# Patient Record
Sex: Male | Born: 1990
Health system: Southern US, Community
[De-identification: ages and names within clinical notes are randomized; demographics above are authoritative.]

## PROBLEM LIST (undated history)

## (undated) DIAGNOSIS — Z889 Allergy status to unspecified drugs, medicaments and biological substances status: Secondary | ICD-10-CM

## (undated) HISTORY — PX: TONSILLECTOMY: SUR1361

## (undated) HISTORY — PX: SURGERY OF LIP: SUR1315

---
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
## 2009-07-15 ENCOUNTER — Emergency Department (HOSPITAL_COMMUNITY): Admission: EM | Admit: 2009-07-15 | Discharge: 2009-07-16 | Payer: Self-pay | Admitting: Emergency Medicine

## 2009-09-01 ENCOUNTER — Emergency Department (HOSPITAL_COMMUNITY): Admission: EM | Admit: 2009-09-01 | Discharge: 2009-09-01 | Payer: Self-pay | Admitting: Emergency Medicine

## 2010-10-27 ENCOUNTER — Emergency Department (HOSPITAL_COMMUNITY)
Admission: EM | Admit: 2010-10-27 | Discharge: 2010-10-27 | Disposition: A | Discharge status: Home / Self Care | Payer: Worker's Compensation | Attending: Emergency Medicine | Admitting: Emergency Medicine

## 2010-10-27 DIAGNOSIS — S61209A Unspecified open wound of unspecified finger without damage to nail, initial encounter: Secondary | ICD-10-CM | POA: Insufficient documentation

## 2010-10-27 DIAGNOSIS — Y929 Unspecified place or not applicable: Secondary | ICD-10-CM | POA: Insufficient documentation

## 2010-10-27 DIAGNOSIS — W319XXA Contact with unspecified machinery, initial encounter: Secondary | ICD-10-CM | POA: Insufficient documentation

## 2010-10-27 DIAGNOSIS — Y990 Civilian activity done for income or pay: Secondary | ICD-10-CM | POA: Insufficient documentation

## 2010-11-13 LAB — BASIC METABOLIC PANEL
CO2: 27 mEq/L (ref 19–32)
Calcium: 9.5 mg/dL (ref 8.4–10.5)
Chloride: 103 mEq/L (ref 96–112)
GFR calc Af Amer: >60 mL/min (ref >60)
Potassium: 3.4 mEq/L — ABNORMAL LOW (ref 3.5–5.1)
Sodium: 138 mEq/L (ref 135–145)

## 2011-10-21 ENCOUNTER — Emergency Department (HOSPITAL_COMMUNITY)
Admission: EM | Admit: 2011-10-21 | Discharge: 2011-10-22 | Disposition: A | Discharge status: Home / Self Care | Payer: Commercial Indemnity | Attending: Emergency Medicine | Admitting: Emergency Medicine

## 2011-10-21 ENCOUNTER — Emergency Department (HOSPITAL_COMMUNITY): Payer: Commercial Indemnity

## 2011-10-21 ENCOUNTER — Encounter (HOSPITAL_COMMUNITY): Payer: Self-pay | Admitting: Emergency Medicine

## 2011-10-21 DIAGNOSIS — J45901 Unspecified asthma with (acute) exacerbation: Secondary | ICD-10-CM | POA: Insufficient documentation

## 2011-10-21 DIAGNOSIS — R197 Diarrhea, unspecified: Secondary | ICD-10-CM | POA: Insufficient documentation

## 2011-10-21 DIAGNOSIS — R112 Nausea with vomiting, unspecified: Secondary | ICD-10-CM | POA: Insufficient documentation

## 2011-10-21 DIAGNOSIS — R0602 Shortness of breath: Secondary | ICD-10-CM | POA: Insufficient documentation

## 2011-10-21 MED ORDER — ALBUTEROL SULFATE (5 MG/ML) 0.5% IN NEBU
2.5000 mg | INHALATION_SOLUTION | Freq: Once | RESPIRATORY_TRACT | Status: AC
  Administered 2011-10-21: 2.5 mg via RESPIRATORY_TRACT
  Filled 2011-10-21: qty 0.5

## 2011-10-21 MED ORDER — ONDANSETRON 4 MG PO TBDP
4.0000 mg | ORAL_TABLET | Freq: Once | ORAL | Status: AC
  Administered 2011-10-22: 4 mg via ORAL
  Filled 2011-10-21: qty 1

## 2011-10-21 MED ORDER — IPRATROPIUM BROMIDE 0.02 % IN SOLN
0.5000 mg | Freq: Once | RESPIRATORY_TRACT | Status: AC
  Administered 2011-10-21: 0.5 mg via RESPIRATORY_TRACT
  Filled 2011-10-21: qty 2.5

## 2011-10-21 MED ORDER — PREDNISONE 20 MG PO TABS
60.0000 mg | ORAL_TABLET | Freq: Once | ORAL | Status: AC
  Administered 2011-10-22: 60 mg via ORAL
  Filled 2011-10-21: qty 3

## 2011-10-21 NOTE — ED Notes (Signed)
Patient complaining of pain in upper left side of chest. States he vomited at home and had a sharp pain with wheezing after vomiting. Placed patient on 2L via nasal cannula. Notified respiratory, waiting arrival.

## 2011-10-21 NOTE — ED Notes (Signed)
Patient states was vomiting and began having an asthma attack.

## 2011-10-22 MED ORDER — PREDNISONE 50 MG PO TABS: ORAL_TABLET | ORAL | Status: AC

## 2011-10-22 MED ORDER — ONDANSETRON HCL 4 MG PO TABS: 4.0000 mg | ORAL_TABLET | Freq: Four times a day (QID) | ORAL | Status: AC

## 2011-10-22 NOTE — ED Provider Notes (Signed)
History     CSN: [id number]  Arrival date & time 10/21/11  2111   First MD Initiated Contact with Patient 10/21/11 2329      Chief Complaint  Patient presents with  . Asthma    (Consider location/radiation/quality/duration/timing/severity/associated sxs/prior treatment) HPI Comments: Pt had 3 episodes each of vomiting and diarrhea at ~ 2000 today.  He then began having asthmatic sxs.  Denies recent fever chill or cough.  Taking his inhaled asthma meds as prescribed.  Feeling much better at exam time after HHN's were given.  Asks for nausea medicine.  Patient is a 21 y.o. male presenting with asthma. The history is provided by the patient. No language interpreter was used.  Asthma This is a chronic problem. The current episode started today. The problem has been resolved. Associated symptoms include nausea and vomiting. Pertinent negatives include no chills or fever. The symptoms are aggravated by nothing. He has tried nothing for the symptoms.    Past Medical History  Diagnosis Date  . Asthma     Past Surgical History  Procedure Date  . Tonsillectomy     No family history on file.  History  Substance Use Topics  . Smoking status: Never Smoker   . Smokeless tobacco: Not on file  . Alcohol Use: Yes      Review of Systems  Constitutional: Negative for fever and chills.  Respiratory: Positive for shortness of breath and wheezing.   Gastrointestinal: Positive for nausea, vomiting and diarrhea.  All other systems reviewed and are negative.    Allergies  Review of patient's allergies indicates no known allergies.  Home Medications   Current Outpatient Rx  Name Route Sig Dispense Refill  . ONDANSETRON HCL 4 MG PO TABS Oral Take 1 tablet (4 mg total) by mouth every 6 (six) hours. 12 tablet 0  . PREDNISONE 50 MG PO TABS  One tab po QD 5 tablet 0    BP 130/74  Pulse 104  Resp 24  Ht 5\' 6"  (1.676 m)  Wt 190 lb (86.183 kg)  BMI 30.67 kg/m2  SpO2 100%  Physical  Exam  Nursing note and vitals reviewed. Constitutional: He is oriented to person, place, and time. He appears well-developed and well-nourished. No distress.  HENT:  Head: Normocephalic and atraumatic.  Eyes: EOM are normal.  Neck: Normal range of motion.  Cardiovascular: Normal rate, regular rhythm, normal heart sounds and intact distal pulses.   Pulmonary/Chest: Effort normal and breath sounds normal. No accessory muscle usage. Not tachypneic. No respiratory distress. He has no decreased breath sounds. He has no wheezes. He has no rhonchi. He has no rales.       Lungs are clear at exam time.    Abdominal: Soft. He exhibits no distension. There is no tenderness. There is no rebound and no guarding.  Musculoskeletal: Normal range of motion.  Neurological: He is alert and oriented to person, place, and time.  Skin: Skin is warm and dry.  Psychiatric: He has a normal mood and affect. Judgment normal.    ED Course  Procedures (including critical care time)  Labs Reviewed - No data to display Dg Chest 2 View  10/21/2011  *RADIOLOGY REPORT*  Clinical Data: Asthma  CHEST - 2 VIEW  Comparison: 09/01/2009  Findings: Normal heart size.  Clear lungs.  No pleural effusion.  IMPRESSION: No active cardiopulmonary disease.  Original Report Authenticated By: Donavan Burnet, M.D.     1. Asthma attack   2. Nausea and  vomiting       MDM  rx-prednisone rx-ondansetron Use inhalers as prescribed.  Follow up with dr. Doyce Loose, PA 10/22/11 0009  Worthy Rancher, PA 10/22/11 1327

## 2011-10-22 NOTE — Discharge Instructions (Signed)
Asthma, Adult Asthma is caused by narrowing of the air passages in the lungs. It may be triggered by pollen, dust, animal dander, molds, some foods, respiratory infections, exposure to smoke, exercise, emotional stress or other allergens (things that cause allergic reactions or allergies). Repeat attacks are common. HOME CARE INSTRUCTIONS   Use prescription medications as ordered by your caregiver.   Avoid pollen, dust, animal dander, molds, smoke and other things that cause attacks at home and at work.   You may have fewer attacks if you decrease dust in your home. Electrostatic air cleaners may help.   It may help to replace your pillows or mattress with materials less likely to cause allergies.   Talk to your caregiver about an action plan for managing asthma attacks at home, including, the use of a peak flow meter which measures the severity of your asthma attack. An action plan can help minimize or stop the attack without having to seek medical care.   If you are not on a fluid restriction, drink 8 to 10 glasses of water each day.   Always have a plan prepared for seeking medical attention, including, calling your physician, accessing local emergency care, and calling 911 (in the [place] for a severe attack.   Discuss possible exercise routines with your caregiver.   If animal dander is the cause of asthma, you may need to get rid of pets.  SEEK MEDICAL CARE IF:   You have wheezing and shortness of breath even if taking medicine to prevent attacks.   You have muscle aches, chest pain or thickening of sputum.   Your sputum changes from clear or white to yellow, green, gray, or bloody.   You have any problems that may be related to the medicine you are taking (such as a rash, itching, swelling or trouble breathing).  SEEK IMMEDIATE MEDICAL CARE IF:   Your usual medicines do not stop your wheezing or there is increased coughing and/or shortness of breath.   You have increased  difficulty breathing.   You have a fever.  MAKE SURE YOU:   Understand these instructions.   Will watch your condition.   Will get help right away if you are not doing well or get worse.  Document Released: 07/29/2005 Document Revised: 07/18/2011 Document Reviewed: 03/16/2008 [facility] Patient Information 2012 [place], [place].   Take the meds as directed.  Follow up with dr. Milford Cage as needed.

## 2011-10-22 NOTE — ED Notes (Signed)
Pt alert & oriented x4, stable gait. Pt given discharge instructions, paperwork & prescription(s). Patient instructed to stop at the registration desk to finish any additional paperwork. pt verbalized understanding. Pt left department w/ no further questions.  

## 2011-10-23 NOTE — ED Provider Notes (Signed)
Medical screening examination/treatment/procedure(s) were performed by non-physician practitioner and as supervising physician I was immediately available for consultation/collaboration.   Vida Roller, MD 10/23/11 [phone]

## 2011-11-04 ENCOUNTER — Encounter (HOSPITAL_COMMUNITY): Payer: Self-pay | Admitting: *Deleted

## 2011-11-04 ENCOUNTER — Emergency Department (HOSPITAL_COMMUNITY)
Admission: EM | Admit: 2011-11-04 | Discharge: 2011-11-04 | Disposition: A | Discharge status: Home / Self Care | Payer: Commercial Indemnity | Attending: Emergency Medicine | Admitting: Emergency Medicine

## 2011-11-04 ENCOUNTER — Emergency Department (HOSPITAL_COMMUNITY): Payer: Commercial Indemnity

## 2011-11-04 DIAGNOSIS — S60222A Contusion of left hand, initial encounter: Secondary | ICD-10-CM

## 2011-11-04 DIAGNOSIS — S6010XA Contusion of unspecified finger with damage to nail, initial encounter: Secondary | ICD-10-CM

## 2011-11-04 DIAGNOSIS — J45909 Unspecified asthma, uncomplicated: Secondary | ICD-10-CM | POA: Insufficient documentation

## 2011-11-04 DIAGNOSIS — W230XXA Caught, crushed, jammed, or pinched between moving objects, initial encounter: Secondary | ICD-10-CM | POA: Insufficient documentation

## 2011-11-04 DIAGNOSIS — S6000XA Contusion of unspecified finger without damage to nail, initial encounter: Secondary | ICD-10-CM | POA: Insufficient documentation

## 2011-11-04 DIAGNOSIS — Y9269 Other specified industrial and construction area as the place of occurrence of the external cause: Secondary | ICD-10-CM | POA: Insufficient documentation

## 2011-11-04 MED ORDER — IBUPROFEN 800 MG PO TABS: 800.0000 mg | ORAL_TABLET | Freq: Three times a day (TID) | ORAL | Status: AC

## 2011-11-04 MED ORDER — HYDROCODONE-ACETAMINOPHEN 5-325 MG PO TABS: 1.0000 | ORAL_TABLET | ORAL | Status: AC | PRN

## 2011-11-04 NOTE — ED Notes (Signed)
Ice pack placed on left hand.

## 2011-11-04 NOTE — ED Provider Notes (Signed)
History     CSN: [id number]  Arrival date & time 11/04/11  1234   First MD Initiated Contact with Patient 11/04/11 1339      Chief Complaint  Patient presents with  . Hand Pain    (Consider location/radiation/quality/duration/timing/severity/associated sxs/prior treatment) HPI Comments: Pt states he got his left hand caught in a tire machine at work. He complains of pain and swelling of the fingers of the left hand. He has not taken anything for pain.   The history is provided by the patient.    Past Medical History  Diagnosis Date  . Asthma     Past Surgical History  Procedure Date  . Tonsillectomy     No family history on file.  History  Substance Use Topics  . Smoking status: Never Smoker   . Smokeless tobacco: Not on file  . Alcohol Use: Yes      Review of Systems  Constitutional: Negative for activity change.       All ROS Neg except as noted in HPI  HENT: Negative for nosebleeds and neck pain.   Eyes: Negative for photophobia and discharge.  Respiratory: Negative for cough, shortness of breath and wheezing.   Cardiovascular: Negative for chest pain and palpitations.  Gastrointestinal: Negative for abdominal pain and blood in stool.  Genitourinary: Negative for dysuria, frequency and hematuria.  Musculoskeletal: Negative for back pain and arthralgias.  Skin: Negative.   Neurological: Negative for dizziness, seizures and speech difficulty.  Psychiatric/Behavioral: Negative for hallucinations and confusion.    Allergies  Review of patient's allergies indicates no known allergies.  Home Medications  No current outpatient prescriptions on file.  BP 136/77  Pulse 67  Temp(Src) 97.7 F (36.5 C) (Oral)  Resp 18  Ht 5\' 6"  (1.676 m)  Wt 220 lb (99.791 kg)  BMI 35.51 kg/m2  SpO2 98%  Physical Exam  Nursing note and vitals reviewed. Constitutional: He is oriented to person, place, and time. He appears well-developed and well-nourished.  Non-toxic  appearance.  HENT:  Head: Normocephalic.  Right Ear: Tympanic membrane and external ear normal.  Left Ear: Tympanic membrane and external ear normal.  Eyes: EOM and lids are normal. Pupils are equal, round, and reactive to light.  Neck: Normal range of motion. Neck supple. Carotid bruit is not present.  Cardiovascular: Normal rate, regular rhythm, normal heart sounds, intact distal pulses and normal pulses.   Pulmonary/Chest: Breath sounds normal. No respiratory distress.  Abdominal: Soft. Bowel sounds are normal. There is no tenderness. There is no guarding.  Musculoskeletal: Normal range of motion.       Pain of the fingers of the left hand. Subungual hematoma of the left 4th finger. Pain with attempted ROM of the 4thand 5th fingers. Good cap refill and sensory.  Lymphadenopathy:       Head (right side): No submandibular adenopathy present.       Head (left side): No submandibular adenopathy present.    He has no cervical adenopathy.  Neurological: He is alert and oriented to person, place, and time. He has normal strength. No cranial nerve deficit or sensory deficit.  Skin: Skin is warm and dry.  Psychiatric: He has a normal mood and affect. His speech is normal.    ED Course  Procedures (including critical care time)  Labs Reviewed - No data to display Dg Hand Complete Left  11/04/2011  *RADIOLOGY REPORT*  Clinical Data: Injured left hand.  LEFT HAND - COMPLETE 3+ VIEW  Comparison: None  Findings: The joint spaces are maintained.  No acute fracture.  IMPRESSION: No acute bony findings.  Original Report Authenticated By: P. Loralie Champagne, M.D.     No diagnosis found.    MDM  I have reviewed nursing notes, vital signs, and all appropriate lab and imaging results for this patient. Neg hand xray discussed with patient. Pt has a subungual hematoma of the left 4th finger. Suggested that we release the hematoma. Pt states he"does not do needles". He refuses procedure and accepts the  possible increase discomfort that could occur. Rx for ibuprofen and norco given to the patient. Ice pack given.       Kathie Dike, [place] 11/05/11 2136

## 2011-11-04 NOTE — ED Notes (Signed)
EDPa in room with pt. 

## 2011-11-04 NOTE — ED Notes (Signed)
Pt was at work when he caught his fingers in a tire machine that had slipped off the rim. Pt can feel touch, pain but fingers are cool

## 2011-11-04 NOTE — Discharge Instructions (Signed)
Contusion (Bruise) of Hand  An injury to the hand may cause bruises (contusions). Contusions are caused by bleeding from small blood vessels (capillaries) that allow blood to leak out into the muscles, tendons, and surrounding soft tissue. This is followed by swelling and pain (inflammation). Contusions of the hand are common because of the use of hands in daily and recreational activities. Signs of a hand injury include pain, swelling, and a color change. Initially the skin may turn blue to purple in color. As the bruise ages, the color turns yellow and orange. Swelling may decrease the movement of the fingers. Contusions are seen more commonly with:   Contact sports (especially in football, wrestling, and basketball).   Use of medications that thin the blood (anticoagulants).   Use of aspirin and nonsteroidal anti-inflammatory agents that decrease the ability of the blood to clot.   Vitamin deficiencies.   Aging.  DIAGNOSIS   Diagnosis of hand injuries can be made by your own observation. If problems continue, a caregiver may be required for further evaluation and treatment. X-rays may be required to make sure there are no broken bones (fractures). Continued problems may require physical therapy for treatment.  RISKS AND COMPLICATIONS   Extensive bleeding and tissue inflammation. This can lead to disability and arthritis-type problems later on if the hand does not heal properly.   Infection of the hand if there are breaks in the skin. This is especially true if the hand injury came from someone's teeth, such as would occur with punching someone in the mouth. This can lead to an infection of the tendons and the membranes surrounding the tendons (sheaths). This infection can have severe complications including a loss of function (a "frozen" hand).   Rupture of the tendons requiring a surgical repair. Failure to repair the tendons can result in loss of function of the hand or fingers.  HOME CARE INSTRUCTIONS     Apply ice to the injury for 15 to 20 minutes, 3 to 4 times per day. Put the ice in a plastic bag and place a towel between the bag of ice and your skin.   An elastic bandage may be used initially for support and to minimize swelling. Do not wrap the hand too tightly. Do not sleep with the elastic bandage on.   Gentle massage from the fingertips towards the elbow will help keep the swelling down. Gently open and close your fist while doing this to maintain range of motion. Do this only after the first few days, when there is no or minimal pain.   Keep your hand above the level of the heart when swelling and pain are present. This will allow the fluid to drain out of the hand, decreasing the amount of swelling. This will improve healing time.   Try to avoid use of the injured hand (except for gentle range of motion) while the hand is hurting. Do not resume use until instructed by your caregiver. Then begin use gradually, do not increase use to the point of pain. If pain does develop, decrease use and continue the above measures, gradually increasing activities that do not cause discomfort until you achieve normal use.   Only take over-the-counter or prescription medicines for pain, discomfort, or fever as directed by your caregiver.   Follow up with your caregiver as directed. Follow-up care may include orthopedic referrals, physical therapy, and rehabilitation. Any delay in obtaining necessary care could result in delayed healing, or temporary or permanent disability.  REHABILITATION     an elastic bandage is no longer needed and you are either pain free or only have minimal pain.   Use ice massage for 10 minutes before and after workouts. Put ice in a plastic bag and place a towel between the bag of ice and your skin. Massage the injured area with the ice pack.  SEEK IMMEDIATE MEDICAL CARE IF:   Your pain and swelling increase, or pain is  uncontrolled with medications.   You have loss of feeling in your hand, or your hand turns cold or blue.   An oral temperature above 102 F (38.9 C) develops, not controlled by medication.   Your hand becomes warm to the touch, or you have increased pain with even slight movement of your fingers.   Your hand does not begin to improve in 1 or 2 days.   The skin is broken and signs of infection occur (fluid draining from the contusion, increasing pain, fever, headache, muscle aches, dizziness, or a general ill feeling).   You develop new, unexplained problems, or an increase of the symptoms that brought you to your caregiver.  MAKE SURE YOU:   Understand these instructions.   Will watch your condition.   Will get help right away if you are not doing well or get worse.  Document Released: 01/18/2002 Document Revised: 07/18/2011 Document Reviewed: 01/05/2010 [facility] Patient Information 2012 [place], [place].Subungual Hematoma A subungual hematoma is a collection of blood under the fingernail. It is caused by an injury to fingers or toes that breaks the blood vessels beneath the nail. The caregiver may have made a hole in the nail to drain the blood. Draining the blood from beneath the nail is painless and usually gives dramatic relief from the pain. Your nail will usually grow back normally. HOME CARE INSTRUCTIONS   Apply ice to the injured area for 15 to 20 minutes, 3 to 4 times per day for the first 1 or 2 days.   Put the ice in a plastic bag and place a towel between the bag of ice and your skin. Discontinue use if it causes pain.   Elevate your hand or your foot whenever possible to decrease pain and swelling.   You may remove the bandage in the number of days directed by your caregiver.   Your nail may fall off. If this occurs, trim it gently to keep it from catching on something and causing further injury.   If you have been given a tetanus shot, your arm may get swollen, red,  and warm to touch at the shot site. This is a normal response to the medicine in the shot. If you did not receive a tetanus shot today because you did not recall when your last one was given, check with your caregiver's office and determine if one is needed. Generally for a "dirty" wound, you should receive a tetanus booster if you have not had one in the last five years. If you have a "clean" wound, you should receive a tetanus booster if you have not had one within the last ten years.  SEEK IMMEDIATE MEDICAL CARE IF:   You develop redness (inflammation) or swelling around the affected nail.   You develop a pus like (purulent) discharge from around the affected nail.   You have pain not controlled with over-the-counter medicines. Only take over-the-counter or prescription medicines for pain, discomfort, or fever as directed by your caregiver.   You have a fever.  Document Released: 07/26/2000 Document Revised: 07/18/2011 Document  Reviewed: 07/17/2011 [facility] Patient Information 2012 [place], [place].

## 2011-11-06 NOTE — ED Provider Notes (Signed)
Medical screening examination/treatment/procedure(s) were performed by non-physician practitioner and as supervising physician I was immediately available for consultation/collaboration.   Yousof Alderman M Florita Nitsch, DO 11/06/11 0758 

## 2012-03-22 ENCOUNTER — Emergency Department (HOSPITAL_COMMUNITY)
Admission: EM | Admit: 2012-03-22 | Discharge: 2012-03-22 | Disposition: A | Discharge status: Home / Self Care | Payer: Commercial Indemnity | Attending: Emergency Medicine | Admitting: Emergency Medicine

## 2012-03-22 ENCOUNTER — Encounter (HOSPITAL_COMMUNITY): Payer: Self-pay | Admitting: Emergency Medicine

## 2012-03-22 DIAGNOSIS — J45909 Unspecified asthma, uncomplicated: Secondary | ICD-10-CM | POA: Insufficient documentation

## 2012-03-22 DIAGNOSIS — R0602 Shortness of breath: Secondary | ICD-10-CM | POA: Insufficient documentation

## 2012-03-22 DIAGNOSIS — R109 Unspecified abdominal pain: Secondary | ICD-10-CM

## 2012-03-22 LAB — COMPREHENSIVE METABOLIC PANEL
ALT: 16 U/L (ref 0–53)
AST: 13 U/L (ref 0–37)
Albumin: 3.8 g/dL (ref 3.5–5.2)
Alkaline Phosphatase: 62 U/L (ref 39–117)
BUN: 15 mg/dL (ref 6–23)
CO2: 25 mEq/L (ref 19–32)
Calcium: 9.9 mg/dL (ref 8.4–10.5)
Chloride: 102 mEq/L (ref 96–112)
Creatinine, Ser: 0.67 mg/dL (ref 0.50–1.35)
GFR calc Af Amer: >90 mL/min (ref >90)
GFR calc non Af Amer: >90 mL/min (ref >90)
Glucose, Bld: 102 mg/dL — ABNORMAL HIGH (ref 70–99)
Potassium: 3.7 mEq/L (ref 3.5–5.1)
Sodium: 136 mEq/L (ref 135–145)
Total Bilirubin: 0.4 mg/dL (ref 0.3–1.2)
Total Protein: 7.1 g/dL (ref 6.0–8.3)

## 2012-03-22 LAB — CBC
HCT: 41.9 % (ref 39.0–52.0)
Hemoglobin: 14.3 g/dL (ref 13.0–17.0)
MCH: 31.3 pg (ref 26.0–34.0)
MCHC: 34.1 g/dL (ref 30.0–36.0)
MCV: 91.7 fL (ref 78.0–100.0)
Platelets: 300 10*3/uL (ref 150–400)
RBC: 4.57 MIL/uL (ref 4.22–5.81)
RDW: 13.5 % (ref 11.5–15.5)
WBC: 9.5 10*3/uL (ref 4.0–10.5)

## 2012-03-22 LAB — URINALYSIS, ROUTINE W REFLEX MICROSCOPIC
Bilirubin Urine: NEGATIVE
Glucose, UA: NEGATIVE mg/dL
Hgb urine dipstick: NEGATIVE
Ketones, ur: NEGATIVE mg/dL
Leukocytes, UA: NEGATIVE
Nitrite: NEGATIVE
Protein, ur: NEGATIVE mg/dL
Specific Gravity, Urine: >1.03 — ABNORMAL HIGH (ref 1.005–1.030)
Urobilinogen, UA: 0.2 mg/dL (ref 0.0–1.0)
pH: 6 (ref 5.0–8.0)

## 2012-03-22 LAB — LIPASE, BLOOD: Lipase: 33 U/L (ref 11–59)

## 2012-03-22 MED ORDER — ONDANSETRON 4 MG PO TBDP
4.0000 mg | ORAL_TABLET | Freq: Once | ORAL | Status: AC
  Administered 2012-03-22: 4 mg via ORAL
  Filled 2012-03-22: qty 1

## 2012-03-22 MED ORDER — SODIUM CHLORIDE 0.9 % IV BOLUS (SEPSIS)
1000.0000 mL | Freq: Once | INTRAVENOUS | Status: AC
  Administered 2012-03-22: 1000 mL via INTRAVENOUS

## 2012-03-22 MED ORDER — ALBUTEROL SULFATE (5 MG/ML) 0.5% IN NEBU
2.5000 mg | INHALATION_SOLUTION | Freq: Once | RESPIRATORY_TRACT | Status: AC
  Administered 2012-03-22: 2.5 mg via RESPIRATORY_TRACT
  Filled 2012-03-22: qty 0.5

## 2012-03-22 MED ORDER — HYDROMORPHONE HCL PF 1 MG/ML IJ SOLN
1.0000 mg | Freq: Once | INTRAMUSCULAR | Status: AC
  Administered 2012-03-22: 1 mg via INTRAVENOUS
  Filled 2012-03-22: qty 1

## 2012-03-22 MED ORDER — ALBUTEROL SULFATE (5 MG/ML) 0.5% IN NEBU: 2.5000 mg | INHALATION_SOLUTION | Freq: Once | RESPIRATORY_TRACT | Status: DC

## 2012-03-22 MED ORDER — ONDANSETRON HCL 4 MG/2ML IJ SOLN
4.0000 mg | Freq: Once | INTRAMUSCULAR | Status: AC
  Administered 2012-03-22: 4 mg via INTRAVENOUS
  Filled 2012-03-22: qty 2

## 2012-03-22 MED ORDER — GI COCKTAIL ~~LOC~~
30.0000 mL | Freq: Once | ORAL | Status: AC
  Administered 2012-03-22: 30 mL via ORAL
  Filled 2012-03-22: qty 30

## 2012-03-22 MED ORDER — ONDANSETRON HCL 4 MG/2ML IJ SOLN: 4.0000 mg | Freq: Once | INTRAMUSCULAR | Status: DC

## 2012-03-22 MED ORDER — OXYCODONE-ACETAMINOPHEN 5-325 MG PO TABS: 1.0000 | ORAL_TABLET | ORAL | Status: AC | PRN

## 2012-03-22 NOTE — ED Notes (Addendum)
Pt presents with shortness of breath x1 hour. States he has a history of asthma. Pt is talking and appears to be in NAD at this time. Also complains of nausea and abdominal pain to the right side. States it is painful to palpation.

## 2012-03-22 NOTE — ED Provider Notes (Signed)
History    20yM with abdominal pain. Onset this morning. Relatively constant since. Upper abdomen, worse on R side. No appreciable exacerbating or relieving factors. Nausea, no vomiting. No diarrhea. No fever or chills. Hx of asthma and also feels mild sob. No CP. NO unusual leg pain or swelling. CSN: [id number]  Arrival date & time 03/22/12  0254   First MD Initiated Contact with Patient 03/22/12 0319      Chief Complaint  Patient presents with  . Shortness of Breath  . Abdominal Pain    (Consider location/radiation/quality/duration/timing/severity/associated sxs/prior treatment) HPI  Past Medical History  Diagnosis Date  . Asthma     Past Surgical History  Procedure Date  . Tonsillectomy     History reviewed. No pertinent family history.  History  Substance Use Topics  . Smoking status: Never Smoker   . Smokeless tobacco: Not on file  . Alcohol Use: No      Review of Systems   Review of symptoms negative unless otherwise noted in HPI.   Allergies  Review of patient's allergies indicates no known allergies.  Home Medications   Current Outpatient Rx  Name Route Sig Dispense Refill  . FEXOFENADINE HCL 30 MG PO TABS Oral Take 30 mg by mouth 2 (two) times daily.    Marland Kitchen FLUTICASONE-SALMETEROL 115-21 MCG/ACT IN AERO Inhalation Inhale 2 puffs into the lungs 2 (two) times daily.    . MOMETASONE FUROATE 50 MCG/ACT NA SUSP Nasal Place 2 sprays into the nose daily.      BP 121/67  Pulse 79  Temp 97.9 F (36.6 C) (Oral)  Resp 18  Ht 5\' 7"  (1.702 m)  Wt 234 lb (106.142 kg)  BMI 36.65 kg/m2  SpO2 98%  Physical Exam  Nursing note and vitals reviewed. Constitutional: He appears well-developed. No distress.       Laying in bed. NAD. Obese.  HENT:  Head: Normocephalic and atraumatic.  Eyes: Conjunctivae are normal. Right eye exhibits no discharge. Left eye exhibits no discharge.  Neck: Neck supple.  Cardiovascular: Normal rate, regular rhythm and normal heart  sounds.  Exam reveals no gallop and no friction rub.   No murmur heard. Pulmonary/Chest: Effort normal and breath sounds normal. No respiratory distress.  Abdominal: Soft. He exhibits no distension and no mass. There is tenderness. There is no rebound and no guarding.       Tenderness in RUQ and epigastrium w/o rebound or guarding  Musculoskeletal: He exhibits no edema and no tenderness.       Lower extremities symmetric as compared to each other. No calf tenderness. Negative Homan's. No palpable cords.   Neurological: He is alert.  Skin: Skin is warm and dry. He is not diaphoretic.  Psychiatric: He has a normal mood and affect. His behavior is normal. Thought content normal.    ED Course  Procedures (including critical care time)  Labs Reviewed  COMPREHENSIVE METABOLIC PANEL - Abnormal; Notable for the following:    Glucose, Bld 102 (*)     All other components within normal limits  URINALYSIS, ROUTINE W REFLEX MICROSCOPIC - Abnormal; Notable for the following:    Specific Gravity, Urine >1.030 (*)     All other components within normal limits  LIPASE, BLOOD  CBC  LAB REPORT - SCANNED   No results found.   1. Abdominal pain       MDM  20yM with abdominal pain. RUQ pain. Possible gastritis. Consider biliary colic, although somewhat unusual in male his age.  Less likely UD. Doubt renal colic.  Very low suspicion for surgical abdomen. Labs unremarkable. Report feeling better with meds. Plan continued symptomatic tx. Return precautions discussed.        Raeford Razor, MD 03/25/12 [phone]

## 2012-03-24 ENCOUNTER — Other Ambulatory Visit (HOSPITAL_COMMUNITY): Payer: Self-pay | Admitting: Pediatrics

## 2012-03-24 DIAGNOSIS — T148XXA Other injury of unspecified body region, initial encounter: Secondary | ICD-10-CM

## 2012-03-24 DIAGNOSIS — R109 Unspecified abdominal pain: Secondary | ICD-10-CM

## 2012-03-25 ENCOUNTER — Ambulatory Visit (HOSPITAL_COMMUNITY)
Admission: RE | Admit: 2012-03-25 | Discharge: 2012-03-25 | Disposition: A | Discharge status: Home / Self Care | Payer: Commercial Indemnity | Source: Ambulatory Visit | Attending: Pediatrics | Admitting: Pediatrics

## 2012-03-25 DIAGNOSIS — T148XXA Other injury of unspecified body region, initial encounter: Secondary | ICD-10-CM

## 2012-03-25 DIAGNOSIS — R109 Unspecified abdominal pain: Secondary | ICD-10-CM

## 2013-05-02 ENCOUNTER — Other Ambulatory Visit: Payer: Self-pay | Admitting: Family Medicine

## 2013-05-12 ENCOUNTER — Emergency Department (HOSPITAL_COMMUNITY)
Admission: EM | Admit: 2013-05-12 | Discharge: 2013-05-13 | Disposition: A | Discharge status: Home / Self Care | Payer: BC Managed Care – PPO | Attending: Emergency Medicine | Admitting: Emergency Medicine

## 2013-05-12 ENCOUNTER — Encounter (HOSPITAL_COMMUNITY): Payer: Self-pay | Admitting: *Deleted

## 2013-05-12 ENCOUNTER — Emergency Department (HOSPITAL_COMMUNITY): Payer: BC Managed Care – PPO

## 2013-05-12 DIAGNOSIS — R1110 Vomiting, unspecified: Secondary | ICD-10-CM | POA: Insufficient documentation

## 2013-05-12 DIAGNOSIS — J45901 Unspecified asthma with (acute) exacerbation: Secondary | ICD-10-CM | POA: Insufficient documentation

## 2013-05-12 DIAGNOSIS — R509 Fever, unspecified: Secondary | ICD-10-CM | POA: Insufficient documentation

## 2013-05-12 DIAGNOSIS — Z880 Allergy status to penicillin: Secondary | ICD-10-CM | POA: Insufficient documentation

## 2013-05-12 DIAGNOSIS — IMO0002 Reserved for concepts with insufficient information to code with codable children: Secondary | ICD-10-CM | POA: Insufficient documentation

## 2013-05-12 DIAGNOSIS — J40 Bronchitis, not specified as acute or chronic: Secondary | ICD-10-CM

## 2013-05-12 DIAGNOSIS — Z79899 Other long term (current) drug therapy: Secondary | ICD-10-CM | POA: Insufficient documentation

## 2013-05-12 MED ORDER — ALBUTEROL SULFATE (5 MG/ML) 0.5% IN NEBU
5.0000 mg | INHALATION_SOLUTION | Freq: Once | RESPIRATORY_TRACT | Status: AC
  Administered 2013-05-12: 5 mg via RESPIRATORY_TRACT
  Filled 2013-05-12: qty 1

## 2013-05-12 NOTE — ED Notes (Addendum)
Pt has had a cold for 2 weeks. Pt states his asthma started flaring up about 3 days ago. Pt has used inhaler and neb treatments without little relief. Pt c/o vomiting today as well. Also mentioned that when he sneezes he has lower right sided back pain.

## 2013-05-12 NOTE — ED Provider Notes (Signed)
CSN: [id number]     Arrival date & time 05/12/13  2010 History  This chart was scribed for Travis Lennert, MD by Dorothey Baseman, ED Scribe. This patient was seen in room APA16A/APA16A and the patient's care was started at 11:13 PM.    Chief Complaint  Patient presents with  . Shortness of Breath  . Nasal Congestion  . Emesis   Patient is a 22 y.o. male presenting with shortness of breath. The history is provided by the patient. No language interpreter was used.  Shortness of Breath Severity:  Moderate Chronicity:  Chronic Relieved by:  Inhaler Associated symptoms: cough, fever and sore throat   Associated symptoms: no abdominal pain, no chest pain, no headaches and no rash   Cough:    Cough characteristics:  Non-productive   Severity:  Moderate   Onset quality:  Sudden   Timing:  Constant   Chronicity:  New Sore throat:    Severity:  Moderate   Onset quality:  Sudden   Timing:  Constant  HPI Comments: Vi Whitesel is a 22 y.o. male with a history of asthma who presents to the Emergency Department complaining of shortness of breath with asssociated cough, fever, and sore throat onset 3 days ago. He reports using his nebulizer with mild, temporary relief.  Past Medical History  Diagnosis Date  . Asthma    Past Surgical History  Procedure Laterality Date  . Tonsillectomy     No family history on file. History  Substance Use Topics  . Smoking status: Never Smoker   . Smokeless tobacco: Not on file  . Alcohol Use: No    Review of Systems  Constitutional: Positive for fever. Negative for appetite change and fatigue.  HENT: Positive for sore throat. Negative for congestion, sinus pressure and ear discharge.   Eyes: Negative for discharge.  Respiratory: Positive for cough and shortness of breath.   Cardiovascular: Negative for chest pain.  Gastrointestinal: Negative for abdominal pain and diarrhea.  Genitourinary: Negative for frequency and hematuria.  Musculoskeletal:  Negative for back pain.  Skin: Negative for rash.  Neurological: Negative for seizures and headaches.  Psychiatric/Behavioral: Negative for hallucinations.    Allergies  Penicillins  Home Medications   Current Outpatient Rx  Name  Route  Sig  Dispense  Refill  . albuterol (PROVENTIL HFA;VENTOLIN HFA) 108 (90 BASE) MCG/ACT inhaler   Inhalation   Inhale 2 puffs into the lungs every 6 (six) hours as needed for wheezing.         . Fluticasone-Salmeterol (ADVAIR) 250-50 MCG/DOSE AEPB   Inhalation   Inhale 1 puff into the lungs every 12 (twelve) hours.         Marland Kitchen guaiFENesin (ROBITUSSIN) 100 MG/5ML SOLN   Oral   Take 5 mLs by mouth every 4 (four) hours as needed.         . Menthol (HALLS COUGH DROPS) 5.8 MG LOZG   Mouth/Throat   Use as directed 1 lozenge in the mouth or throat daily as needed (FOR COLD SYMPTOMS).         . mometasone (NASONEX) 50 MCG/ACT nasal spray   Nasal   Place 2 sprays into the nose daily.         . Pseudoeph-Doxylamine-DM-APAP 30-6.25-15-325 MG CAPS   Oral   Take 1 capsule by mouth daily as needed (FOR SYMPTOMS).          Triage Vitals: BP 130/57  Pulse 97  Temp(Src) 97.7 F (36.5 C) (Oral)  Resp 24  Ht 5\' 6"  (1.676 m)  Wt 230 lb (104.327 kg)  BMI 37.14 kg/m2  SpO2 98%  Physical Exam  Constitutional: He is oriented to person, place, and time. He appears well-developed.  HENT:  Head: Normocephalic.  Mouth/Throat: Oropharynx is clear and moist.  Eyes: Conjunctivae and EOM are normal. No scleral icterus.  Neck: Neck supple. No thyromegaly present.  Cardiovascular: Normal rate and regular rhythm.  Exam reveals no gallop and no friction rub.   No murmur heard. Pulmonary/Chest: Effort normal. No stridor. He has wheezes. He has no rales. He exhibits no tenderness.  Minimal wheezing bilaterally  Abdominal: He exhibits no distension. There is no tenderness. There is no rebound.  Musculoskeletal: Normal range of motion. He exhibits no  edema.  Lymphadenopathy:    He has no cervical adenopathy.  Neurological: He is oriented to person, place, and time. He exhibits normal muscle tone. Coordination normal.  Skin: No rash noted. No erythema.  Psychiatric: He has a normal mood and affect. His behavior is normal.    ED Course  Procedures (including critical care time)  DIAGNOSTIC STUDIES: Oxygen Saturation is 98% on room air, normal by my interpretation.    COORDINATION OF CARE: 11:16PM- Will order a chest x-ray and albuterol. Discussed treatment plan with patient at bedside and patient verbalized agreement.     Labs Review Labs Reviewed - No data to display Imaging Review No results found.  MDM  No diagnosis found.  The chart was scribed for me under my direct supervision.  I personally performed the history, physical, and medical decision making and all procedures in the evaluation of this patient.Travis Lennert, MD 05/13/13 [phone]

## 2013-05-13 MED ORDER — PREDNISONE 10 MG PO TABS: 20.0000 mg | ORAL_TABLET | Freq: Every day | ORAL | Status: DC

## 2013-05-13 MED ORDER — AMOXICILLIN 500 MG PO CAPS: 500.0000 mg | ORAL_CAPSULE | Freq: Three times a day (TID) | ORAL | Status: DC

## 2013-05-13 MED ORDER — AZITHROMYCIN 250 MG PO TABS: ORAL_TABLET | ORAL | Status: DC

## 2013-06-19 ENCOUNTER — Encounter: Payer: Self-pay | Admitting: *Deleted

## 2013-06-24 ENCOUNTER — Encounter: Payer: Self-pay | Admitting: General Practice

## 2013-06-24 ENCOUNTER — Encounter: Payer: Self-pay | Admitting: Family Medicine

## 2013-06-24 ENCOUNTER — Ambulatory Visit (INDEPENDENT_AMBULATORY_CARE_PROVIDER_SITE_OTHER): Payer: BC Managed Care – PPO | Admitting: Family Medicine

## 2013-06-24 VITALS — BP 116/78 | Temp 97.8°F

## 2013-06-24 DIAGNOSIS — J4540 Moderate persistent asthma, uncomplicated: Secondary | ICD-10-CM | POA: Insufficient documentation

## 2013-06-24 DIAGNOSIS — Z23 Encounter for immunization: Secondary | ICD-10-CM

## 2013-06-24 DIAGNOSIS — J45909 Unspecified asthma, uncomplicated: Secondary | ICD-10-CM

## 2013-06-24 DIAGNOSIS — J4520 Mild intermittent asthma, uncomplicated: Secondary | ICD-10-CM

## 2013-06-24 MED ORDER — FLUTICASONE-SALMETEROL 250-50 MCG/DOSE IN AEPB: 1.0000 | INHALATION_SPRAY | Freq: Two times a day (BID) | RESPIRATORY_TRACT | Status: DC

## 2013-06-24 MED ORDER — ALBUTEROL SULFATE HFA 108 (90 BASE) MCG/ACT IN AERS: 2.0000 | INHALATION_SPRAY | Freq: Four times a day (QID) | RESPIRATORY_TRACT | Status: DC | PRN

## 2013-06-24 NOTE — Progress Notes (Signed)
  Subjective:    Patient ID: Travis Aguilar, male    DOB: 1990-10-06, 22 y.o.   MRN: [id number]  HPIAsthma check up. Needs refill on inhalers and requesting a prescription for a nebulizer machine.  Long discussion held regarding asthma the proper treatment on that and proper way to keep it under control Warts on hands. He's had these for about the past several months on his hands.   Requesting flu vaccine.   Review of Systems  Constitutional: Negative for fever, activity change and appetite change.  HENT: Negative for congestion and rhinorrhea.   Eyes: Negative for discharge.  Respiratory: Negative for cough and wheezing.   Cardiovascular: Negative for chest pain.  Gastrointestinal: Negative for vomiting, abdominal pain and blood in stool.  Genitourinary: Negative for frequency and difficulty urinating.  Musculoskeletal: Negative for neck pain.  Skin: Negative for rash.  Allergic/Immunologic: Negative for environmental allergies and food allergies.  Neurological: Negative for weakness and headaches.  Psychiatric/Behavioral: Negative for agitation.       Objective:   Physical Exam  Constitutional: He appears well-developed and well-nourished.  HENT:  Head: Normocephalic and atraumatic.  Right Ear: External ear normal.  Left Ear: External ear normal.  Nose: Nose normal.  Mouth/Throat: Oropharynx is clear and moist.  Eyes: EOM are normal. Pupils are equal, round, and reactive to light.  Neck: Normal range of motion. Neck supple. No thyromegaly present.  Cardiovascular: Normal rate, regular rhythm and normal heart sounds.   No murmur heard. Pulmonary/Chest: Effort normal and breath sounds normal. No respiratory distress. He has no wheezes.  Abdominal: Soft. Bowel sounds are normal. He exhibits no distension and no mass. There is no tenderness.  Genitourinary: Penis normal.  Musculoskeletal: Normal range of motion. He exhibits no edema.  Lymphadenopathy:    He has no cervical  adenopathy.  Neurological: He exhibits normal muscle tone.  Skin: Skin is warm and dry. No erythema.  Psychiatric: He has a normal mood and affect. His behavior is normal. Judgment normal.          Assessment & Plan:  Wart- may need derm, salysylate in petroleum if not doing better over the course of the next couple months let us know we will set up with dermatology Flu vaccine Asthma-renewal medicines. Importance of compliance. Patient will followup in one year sooner problems, recommend the importance of if seeing decline in pulmonary function to immediately come in to be seen Prescription for nebulizer given patient was told that actually inhaler works just as well as nebulizer but he states whenever he gets a bad attack he does better with the nebulizer.

## 2013-07-26 ENCOUNTER — Encounter: Payer: Self-pay | Admitting: Family Medicine

## 2013-07-26 ENCOUNTER — Ambulatory Visit (INDEPENDENT_AMBULATORY_CARE_PROVIDER_SITE_OTHER): Payer: BC Managed Care – PPO | Admitting: Family Medicine

## 2013-07-26 VITALS — BP 128/82 | Temp 98.3°F | Ht 66.0 in | Wt 248.8 lb

## 2013-07-26 DIAGNOSIS — A088 Other specified intestinal infections: Secondary | ICD-10-CM

## 2013-07-26 DIAGNOSIS — A084 Viral intestinal infection, unspecified: Secondary | ICD-10-CM

## 2013-07-26 MED ORDER — DIPHENOXYLATE-ATROPINE 2.5-0.025 MG PO TABS: 1.0000 | ORAL_TABLET | Freq: Four times a day (QID) | ORAL | Status: DC | PRN

## 2013-07-26 MED ORDER — ONDANSETRON HCL 8 MG PO TABS: 8.0000 mg | ORAL_TABLET | Freq: Three times a day (TID) | ORAL | Status: DC | PRN

## 2013-07-26 NOTE — Progress Notes (Signed)
   Subjective:    Patient ID: Travis Aguilar, male    DOB: 07-22-1991, 22 y.o.   MRN: [id number]  Abdominal Pain This is a new problem. The current episode started in the past 7 days. The problem occurs constantly. The pain is located in the epigastric region. The quality of the pain is burning. The abdominal pain does not radiate. Associated symptoms include diarrhea, nausea and vomiting. The pain is aggravated by eating. The pain is relieved by bowel movements and vomiting. He has tried antacids for the symptoms. The treatment provided no relief.   Started Vomiting on Thursday and diarrhea Crackers and bland food No blood in stool, no fever Felt dizzy yesterday Urinating ok PMH benign  Review of Systems  Gastrointestinal: Positive for nausea, vomiting, abdominal pain and diarrhea.   No cough wheeze    Objective:   Physical Exam  Lungs clear hearts regular abdomen soft no guarding or rebound      Assessment & Plan:  Gastroenteritis-viral should gradually get better supportive measures given. Prescriptions given. Warning signs discussed

## 2013-12-27 ENCOUNTER — Ambulatory Visit (INDEPENDENT_AMBULATORY_CARE_PROVIDER_SITE_OTHER): Payer: BC Managed Care – PPO | Admitting: Family Medicine

## 2013-12-27 ENCOUNTER — Encounter: Payer: Self-pay | Admitting: General Practice

## 2013-12-27 ENCOUNTER — Encounter: Payer: Self-pay | Admitting: Family Medicine

## 2013-12-27 VITALS — BP 128/68 | Ht 66.0 in | Wt 250.0 lb

## 2013-12-27 DIAGNOSIS — M5430 Sciatica, unspecified side: Secondary | ICD-10-CM

## 2013-12-27 DIAGNOSIS — M5432 Sciatica, left side: Secondary | ICD-10-CM

## 2013-12-27 MED ORDER — CHLORZOXAZONE 500 MG PO TABS: 500.0000 mg | ORAL_TABLET | Freq: Four times a day (QID) | ORAL | Status: DC | PRN

## 2013-12-27 NOTE — Progress Notes (Signed)
   Subjective:    Patient ID: Travis Aguilar, male    DOB: Jun 23, 1991, 23 y.o.   MRN: [id number]  Back Pain This is a new problem. The current episode started in the past 7 days. The pain is present in the lumbar spine (left side). The quality of the pain is described as stabbing. The pain radiates to the left thigh. The symptoms are aggravated by sitting and lying down. Stiffness is present in the morning. He has tried muscle relaxant for the symptoms. The treatment provided moderate relief.  tried muscle relaxers and advil helped a little Burning pain down the leg First occurrence  Was changing a tire on Saturday and pt states he heard his back pop.    Review of Systems  Musculoskeletal: Positive for back pain.   No fever dysuria vomiting.    Objective:   Physical Exam Positive straight leg raise on the left there is no muscle weakness abdomen is soft lungs clear heart regular       Assessment & Plan:  Lumbar pain with strain-muscle relaxer, Advil OTC anti-inflammatory, rest, stretching, if not significantly better over the next 4-6 weeks may need further intervention. Let us know if not doing significantly better over the next couple weeks. Some sciatica as well.

## 2014-02-06 ENCOUNTER — Encounter (HOSPITAL_COMMUNITY): Payer: Self-pay | Admitting: Emergency Medicine

## 2014-02-06 ENCOUNTER — Emergency Department (HOSPITAL_COMMUNITY): Payer: BC Managed Care – PPO

## 2014-02-06 ENCOUNTER — Emergency Department (HOSPITAL_COMMUNITY)
Admission: EM | Admit: 2014-02-06 | Discharge: 2014-02-06 | Disposition: A | Discharge status: Home / Self Care | Payer: BC Managed Care – PPO | Attending: Emergency Medicine | Admitting: Emergency Medicine

## 2014-02-06 DIAGNOSIS — Z880 Allergy status to penicillin: Secondary | ICD-10-CM | POA: Insufficient documentation

## 2014-02-06 DIAGNOSIS — IMO0002 Reserved for concepts with insufficient information to code with codable children: Secondary | ICD-10-CM | POA: Diagnosis not present

## 2014-02-06 DIAGNOSIS — J45901 Unspecified asthma with (acute) exacerbation: Secondary | ICD-10-CM | POA: Diagnosis not present

## 2014-02-06 DIAGNOSIS — Z79899 Other long term (current) drug therapy: Secondary | ICD-10-CM | POA: Insufficient documentation

## 2014-02-06 DIAGNOSIS — R0602 Shortness of breath: Secondary | ICD-10-CM | POA: Diagnosis present

## 2014-02-06 DIAGNOSIS — R0600 Dyspnea, unspecified: Secondary | ICD-10-CM

## 2014-02-06 LAB — BASIC METABOLIC PANEL
BUN: 9 mg/dL (ref 6–23)
CALCIUM: 9.6 mg/dL (ref 8.4–10.5)
CO2: 21 meq/L (ref 19–32)
CREATININE: 0.74 mg/dL (ref 0.50–1.35)
Chloride: 101 mEq/L (ref 96–112)
GFR calc Af Amer: >90 mL/min (ref >90)
Glucose, Bld: 119 mg/dL — ABNORMAL HIGH (ref 70–99)
Potassium: 3.2 mEq/L — ABNORMAL LOW (ref 3.7–5.3)
SODIUM: 140 meq/L (ref 137–147)

## 2014-02-06 LAB — CBC WITH DIFFERENTIAL/PLATELET
BASOS ABS: 0 10*3/uL (ref 0.0–0.1)
Basophils Relative: 0 % (ref 0–1)
EOS ABS: 0.2 10*3/uL (ref 0.0–0.7)
EOS PCT: 2 % (ref 0–5)
HCT: 41.4 % (ref 39.0–52.0)
Hemoglobin: 14.6 g/dL (ref 13.0–17.0)
Lymphocytes Relative: 35 % (ref 12–46)
Lymphs Abs: 4 10*3/uL (ref 0.7–4.0)
MCH: 31.5 pg (ref 26.0–34.0)
MCHC: 35.3 g/dL (ref 30.0–36.0)
MCV: 89.4 fL (ref 78.0–100.0)
MONO ABS: 1 10*3/uL (ref 0.1–1.0)
Monocytes Relative: 9 % (ref 3–12)
Neutro Abs: 6.1 10*3/uL (ref 1.7–7.7)
Neutrophils Relative %: 54 % (ref 43–77)
Platelets: 362 10*3/uL (ref 150–400)
RBC: 4.63 MIL/uL (ref 4.22–5.81)
RDW: 13.2 % (ref 11.5–15.5)
WBC: 11.4 10*3/uL — AB (ref 4.0–10.5)

## 2014-02-06 MED ORDER — METHYLPREDNISOLONE SODIUM SUCC 125 MG IJ SOLR
125.0000 mg | INTRAMUSCULAR | Status: AC
  Administered 2014-02-06: 125 mg via INTRAVENOUS
  Filled 2014-02-06: qty 2

## 2014-02-06 MED ORDER — IPRATROPIUM-ALBUTEROL 0.5-2.5 (3) MG/3ML IN SOLN
3.0000 mL | Freq: Once | RESPIRATORY_TRACT | Status: AC
Start: 2014-02-06 — End: 2014-02-06
  Administered 2014-02-06: 3 mL via RESPIRATORY_TRACT
  Filled 2014-02-06: qty 3

## 2014-02-06 MED ORDER — AZITHROMYCIN 250 MG PO TABS
500.0000 mg | ORAL_TABLET | Freq: Once | ORAL | Status: AC
  Administered 2014-02-06: 500 mg via ORAL
  Filled 2014-02-06: qty 2

## 2014-02-06 MED ORDER — AZITHROMYCIN 250 MG PO TABS: 250.0000 mg | ORAL_TABLET | Freq: Every day | ORAL | Status: AC

## 2014-02-06 MED ORDER — SODIUM CHLORIDE 0.9 % IV BOLUS (SEPSIS)
1000.0000 mL | Freq: Once | INTRAVENOUS | Status: AC
  Administered 2014-02-06: 1000 mL via INTRAVENOUS

## 2014-02-06 MED ORDER — PREDNISONE 20 MG PO TABS: 60.0000 mg | ORAL_TABLET | Freq: Every day | ORAL | Status: AC

## 2014-02-06 NOTE — ED Notes (Signed)
PT c/o sob started 3hrs ago. PT took an albuterol inhaler prior to arrival to ED. PT c/o left sided chest pain.

## 2014-02-06 NOTE — ED Provider Notes (Signed)
CSN: [id number]     Arrival date & time 02/06/14  1846 History   First MD Initiated Contact with Patient 02/06/14 1917     Chief Complaint  Patient presents with  . Shortness of Breath     (Consider location/radiation/quality/duration/timing/severity/associated sxs/prior Treatment) HPI Patient presents with new dyspnea. Symptoms began several hours ago, without precipitant.  Since onset symptoms have been persistent, in spite of attempts at control with home medication. Patient denies concurrent fevers, chills, lightheadedness, persistent chest pain. There was a brief moment of chest pain at symptom onset, but none since. Patient does not smoke, does not drink, works 2 jobs. Patient has had episodes of night sweats, denies weight loss, weight gain, rash.  Past Medical History  Diagnosis Date  . Asthma    Past Surgical History  Procedure Laterality Date  . Tonsillectomy    . Surgery of lip      age 23 or [age]   Family History  Problem Relation Age of Onset  . Cancer Other     breast   History  Substance Use Topics  . Smoking status: Never Smoker   . Smokeless tobacco: Not on file  . Alcohol Use: No    Review of Systems  Constitutional:       Per HPI, otherwise negative  HENT:       Per HPI, otherwise negative  Respiratory:       Per HPI, otherwise negative  Cardiovascular:       Per HPI, otherwise negative  Gastrointestinal: Negative for vomiting.  Endocrine:       Negative aside from HPI  Genitourinary:       Neg aside from HPI   Musculoskeletal:       Per HPI, otherwise negative  Skin: Negative.   Neurological: Negative for syncope.      Allergies  Amoxil and Penicillins  Home Medications   Prior to Admission medications   Medication Sig Start Date End Date Taking? Authorizing Provider  albuterol (PROVENTIL HFA;VENTOLIN HFA) 108 (90 BASE) MCG/ACT inhaler Inhale 2 puffs into the lungs every 6 (six) hours as needed for wheezing. 06/24/13  Yes Babs SciaraScott A  Luking, MD  chlorzoxazone (PARAFON FORTE DSC) 500 MG tablet Take 1 tablet (500 mg total) by mouth 4 (four) times daily as needed for muscle spasms. 12/27/13  Yes Babs SciaraScott A Luking, MD  Fluticasone-Salmeterol (ADVAIR) 250-50 MCG/DOSE AEPB Inhale 1 puff into the lungs every 12 (twelve) hours. 06/24/13  Yes Babs SciaraScott A Luking, MD  mometasone (NASONEX) 50 MCG/ACT nasal spray Place 2 sprays into the nose daily as needed.    Yes Historical Provider, MD   SpO2 100% Physical Exam  Nursing note and vitals reviewed. Constitutional: He is oriented to person, place, and time. He appears well-developed. No distress.  HENT:  Head: Normocephalic and atraumatic.  Eyes: Conjunctivae and EOM are normal.  Cardiovascular: Normal rate and regular rhythm.   Pulmonary/Chest: No accessory muscle usage or stridor. Tachypnea noted. No respiratory distress. He has decreased breath sounds.  Abdominal: He exhibits no distension.  Musculoskeletal: He exhibits no edema.  Neurological: He is alert and oriented to person, place, and time.  Skin: Skin is warm and dry.  Psychiatric: He has a normal mood and affect.    ED Course  Procedures (including critical care time) Labs Review Labs Reviewed  CBC WITH DIFFERENTIAL - Abnormal; Notable for the following:    WBC 11.4 (*)    All other components within normal limits  BASIC METABOLIC  PANEL    Imaging Review Dg Chest 2 View  02/06/2014   CLINICAL DATA:  Left-sided chest pain and shortness of breath.  EXAM: CHEST  2 VIEW  COMPARISON:  05/12/2013.  FINDINGS: Cardiopericardial silhouette within normal limits. Mediastinal contours normal. Trachea midline. No airspace disease or effusion. Monitoring leads project over the chest. Apical lordotic projection.  IMPRESSION: No active cardiopulmonary disease.   Electronically Signed   By: Andreas NewportGeoffrey  Lamke M.D.   On: 02/06/2014 20:25   I reviewed the x-ray with the family, agree with the interpretation.   Patient substantially better  with albuterol therapy, steroids, antibiotics  9:32 PM Patient substantially better.  MDM   This young male with history of asthma presents with new meds, decreased breath sounds, ongoing difficulty breathing. Patient is awake alert, afebrile, but with his history of asthma, as well as his night sweats, was started on antibiotics, and with improvement here, was appropriate for discharge with outpatient followup. Low suspicion for atypical ACS, none for PE, or bacteremia/sepsis.      Gerhard Munchobert Lockwood, MD 02/06/14 2133

## 2014-02-06 NOTE — ED Notes (Signed)
Patient states shortness of breath with left upper chest pain. Patient states he has a history of asthma, and he is having a hard time breathing. Patient is alert and oriented X4. Pt used meter doses inhaler prior to ED visit with no relief.

## 2014-02-06 NOTE — Progress Notes (Signed)
Pt states this is asthma attack, He has good air movement but does feel like his throat is closing. He complains of throat pain -- He does have some wheezing coming from throat area. He has had a lot of coughing before. I suspect upper respiratory infection. He does appear also very anxious. Does not appear with stridor but he has lost voice.

## 2014-02-06 NOTE — Discharge Instructions (Signed)
In addition to the prescribed medication, please use your albuterol therapy every 4 hours for the next 2 days.      Asthma Asthma is a recurring condition in which the airways tighten and narrow. Asthma can make it difficult to breathe. It can cause coughing, wheezing, and shortness of breath. Asthma episodes, also called asthma attacks, range from minor to life-threatening. Asthma cannot be cured, but medicines and lifestyle changes can help control it. CAUSES Asthma is believed to be caused by inherited (genetic) and environmental factors, but its exact cause is unknown. Asthma may be triggered by allergens, lung infections, or irritants in the air. Asthma triggers are different for each person. Common triggers include:   Animal dander.  Dust mites.  Cockroaches.  Pollen from trees or grass.  Mold.  Smoke.  Air pollutants such as dust, household cleaners, hair sprays, aerosol sprays, paint fumes, strong chemicals, or strong odors.  Cold air, weather changes, and winds (which increase molds and pollens in the air).  Strong emotional expressions such as crying or laughing hard.  Stress.  Certain medicines (such as aspirin) or types of drugs (such as beta-blockers).  Sulfites in foods and drinks. Foods and drinks that may contain sulfites include dried fruit, potato chips, and sparkling grape juice.  Infections or inflammatory conditions such as the flu, a cold, or an inflammation of the nasal membranes (rhinitis).  Gastroesophageal reflux disease (GERD).  Exercise or strenuous activity. SYMPTOMS Symptoms may occur immediately after asthma is triggered or many hours later. Symptoms include:  Wheezing.  Excessive nighttime or early morning coughing.  Frequent or severe coughing with a common cold.  Chest tightness.  Shortness of breath. DIAGNOSIS  The diagnosis of asthma is made by a review of your medical history and a physical exam. Tests may also be performed.  These may include:  Lung function studies. These tests show how much air you breathe in and out.  Allergy tests.  Imaging tests such as X-rays. TREATMENT  Asthma cannot be cured, but it can usually be controlled. Treatment involves identifying and avoiding your asthma triggers. It also involves medicines. There are 2 classes of medicine used for asthma treatment:   Controller medicines. These prevent asthma symptoms from occurring. They are usually taken every day.  Reliever or rescue medicines. These quickly relieve asthma symptoms. They are used as needed and provide short-term relief. Your health care provider will help you create an asthma action plan. An asthma action plan is a written plan for managing and treating your asthma attacks. It includes a list of your asthma triggers and how they may be avoided. It also includes information on when medicines should be taken and when their dosage should be changed. An action plan may also involve the use of a device called a peak flow meter. A peak flow meter measures how well the lungs are working. It helps you monitor your condition. HOME CARE INSTRUCTIONS   Take medicine as directed by your health care provider. Speak with your health care provider if you have questions about how or when to take the medicines.  Use a peak flow meter as directed by your health care provider. Record and keep track of readings.  Understand and use the action plan to help minimize or stop an asthma attack without needing to seek medical care.  Control your home environment in the following ways to help prevent asthma attacks:  Do not smoke. Avoid being exposed to secondhand smoke.  Change your heating  and air conditioning filter regularly.  Limit your use of fireplaces and wood stoves.  Get rid of pests (such as roaches and mice) and their droppings.  Throw away plants if you see mold on them.  Clean your floors and dust regularly. Use unscented  cleaning products.  Try to have someone else vacuum for you regularly. Stay out of rooms while they are being vacuumed and for a short while afterward. If you vacuum, use a dust mask from a hardware store, a double-layered or microfilter vacuum cleaner bag, or a vacuum cleaner with a HEPA filter.  Replace carpet with wood, tile, or vinyl flooring. Carpet can trap dander and dust.  Use allergy-proof pillows, mattress covers, and box spring covers.  Wash bed sheets and blankets every week in hot water and dry them in a dryer.  Use blankets that are made of polyester or cotton.  Clean bathrooms and kitchens with bleach. If possible, have someone repaint the walls in these rooms with mold-resistant paint. Keep out of the rooms that are being cleaned and painted.  Wash hands frequently. SEEK MEDICAL CARE IF:   You have wheezing, shortness of breath, or a cough even if taking medicine to prevent attacks.  The colored mucus you cough up (sputum) is thicker than usual.  Your sputum changes from clear or white to yellow, green, gray, or bloody.  You have any problems that may be related to the medicines you are taking (such as a rash, itching, swelling, or trouble breathing).  You are using a reliever medicine more than 2-3 times per week.  Your peak flow is still at 50-79% of your personal best after following your action plan for 1 hour. SEEK IMMEDIATE MEDICAL CARE IF:   You seem to be getting worse and are unresponsive to treatment during an asthma attack.  You are short of breath even at rest.  You get short of breath when doing very little physical activity.  You have difficulty eating, drinking, or talking due to asthma symptoms.  You develop chest pain.  You develop a fast heartbeat.  You have a bluish color to your lips or fingernails.  You are lightheaded, dizzy, or faint.  Your peak flow is less than 50% of your personal best.  You have a fever or persistent symptoms  for more than 2-3 days.  You have a fever and symptoms suddenly get worse. MAKE SURE YOU:   Understand these instructions.  Will watch your condition.  Will get help right away if you are not doing well or get worse. Document Released: 07/29/2005 Document Revised: 08/03/2013 Document Reviewed: 02/25/2013 [facility] Patient Information 2015 [place]Care, [place]. This information is not intended to replace advice given to you by your health care provider. Make sure you discuss any questions you have with your health care provider.

## 2014-04-13 ENCOUNTER — Encounter (HOSPITAL_COMMUNITY): Payer: Self-pay | Admitting: Emergency Medicine

## 2014-04-13 ENCOUNTER — Emergency Department (HOSPITAL_COMMUNITY)
Admission: EM | Admit: 2014-04-13 | Discharge: 2014-04-13 | Disposition: A | Discharge status: Home / Self Care | Payer: BC Managed Care – PPO | Attending: Emergency Medicine | Admitting: Emergency Medicine

## 2014-04-13 ENCOUNTER — Emergency Department (HOSPITAL_COMMUNITY): Payer: BC Managed Care – PPO

## 2014-04-13 DIAGNOSIS — IMO0002 Reserved for concepts with insufficient information to code with codable children: Secondary | ICD-10-CM | POA: Diagnosis not present

## 2014-04-13 DIAGNOSIS — Z79899 Other long term (current) drug therapy: Secondary | ICD-10-CM | POA: Diagnosis not present

## 2014-04-13 DIAGNOSIS — Z880 Allergy status to penicillin: Secondary | ICD-10-CM | POA: Diagnosis not present

## 2014-04-13 DIAGNOSIS — J069 Acute upper respiratory infection, unspecified: Secondary | ICD-10-CM | POA: Insufficient documentation

## 2014-04-13 DIAGNOSIS — R05 Cough: Secondary | ICD-10-CM | POA: Diagnosis present

## 2014-04-13 DIAGNOSIS — R059 Cough, unspecified: Secondary | ICD-10-CM | POA: Diagnosis present

## 2014-04-13 DIAGNOSIS — J45901 Unspecified asthma with (acute) exacerbation: Secondary | ICD-10-CM | POA: Diagnosis not present

## 2014-04-13 DIAGNOSIS — J019 Acute sinusitis, unspecified: Secondary | ICD-10-CM | POA: Diagnosis not present

## 2014-04-13 MED ORDER — LORATADINE-PSEUDOEPHEDRINE ER 5-120 MG PO TB12: 1.0000 | ORAL_TABLET | Freq: Two times a day (BID) | ORAL | Status: DC

## 2014-04-13 MED ORDER — PREDNISONE 50 MG PO TABS
60.0000 mg | ORAL_TABLET | Freq: Once | ORAL | Status: AC
  Administered 2014-04-13: 60 mg via ORAL
  Filled 2014-04-13 (×2): qty 1

## 2014-04-13 MED ORDER — PREDNISONE 10 MG PO TABS: ORAL_TABLET | ORAL | Status: DC

## 2014-04-13 MED ORDER — PSEUDOEPHEDRINE HCL 60 MG PO TABS
60.0000 mg | ORAL_TABLET | Freq: Once | ORAL | Status: AC
Start: 2014-04-13 — End: 2014-04-13
  Administered 2014-04-13: 60 mg via ORAL
  Filled 2014-04-13: qty 1

## 2014-04-13 NOTE — ED Provider Notes (Signed)
CSN: [id number]     Arrival date & time 04/13/14  0802 History   First MD Initiated Contact with Patient 04/13/14 [phone]     Chief Complaint  Patient presents with  . Cough     (Consider location/radiation/quality/duration/timing/severity/associated sxs/prior Treatment) Patient is a 23 y.o. male presenting with cough. The history is provided by the patient.  Cough Cough characteristics:  Productive Sputum characteristics:  Yellow Severity:  Moderate Onset quality:  Gradual Duration:  3 days Timing:  Intermittent Progression:  Worsening Chronicity:  New Smoker: no   Context: sick contacts and upper respiratory infection   Relieved by:  Nothing Worsened by:  Nothing tried Ineffective treatments:  None tried Associated symptoms: chills, fever, rhinorrhea, sinus congestion and wheezing   Associated symptoms: no chest pain, no eye discharge and no shortness of breath   Risk factors: no recent travel     Past Medical History  Diagnosis Date  . Asthma    Past Surgical History  Procedure Laterality Date  . Tonsillectomy    . Surgery of lip      age 80 or 34   Family History  Problem Relation Age of Onset  . Cancer Other     breast   History  Substance Use Topics  . Smoking status: Never Smoker   . Smokeless tobacco: Not on file  . Alcohol Use: No    Review of Systems  Constitutional: Positive for fever and chills. Negative for activity change.       All ROS Neg except as noted in HPI  HENT: Positive for rhinorrhea.   Eyes: Negative for photophobia and discharge.  Respiratory: Positive for cough and wheezing. Negative for shortness of breath.   Cardiovascular: Negative for chest pain and palpitations.  Gastrointestinal: Negative for abdominal pain and blood in stool.  Genitourinary: Negative for dysuria, frequency and hematuria.  Musculoskeletal: Negative for arthralgias, back pain and neck pain.  Skin: Negative.   Neurological: Negative for dizziness, seizures and  speech difficulty.  Psychiatric/Behavioral: Negative for hallucinations and confusion.      Allergies  Amoxil and Penicillins  Home Medications   Prior to Admission medications   Medication Sig Start Date End Date Taking? Authorizing Provider  albuterol (PROVENTIL HFA;VENTOLIN HFA) 108 (90 BASE) MCG/ACT inhaler Inhale 2 puffs into the lungs every 6 (six) hours as needed for wheezing. 06/24/13  Yes Babs Sciara, MD  Fluticasone-Salmeterol (ADVAIR) 250-50 MCG/DOSE AEPB Inhale 1 puff into the lungs every 12 (twelve) hours. 06/24/13  Yes Babs Sciara, MD   BP 109/55  Pulse 74  Temp(Src) 98 F (36.7 C) (Oral)  Resp 16  Ht  (1.676 m)  Wt 245 lb (111.131 kg)  BMI 39.56 kg/m2  SpO2 100% Physical Exam  Nursing note and vitals reviewed. Constitutional: He is oriented to person, place, and time. He appears well-developed and well-nourished.  Non-toxic appearance.  HENT:  Head: Normocephalic.  Right Ear: Tympanic membrane and external ear normal.  Left Ear: Tympanic membrane and external ear normal.  Nasal congestion present  Eyes: EOM and lids are normal. Pupils are equal, round, and reactive to light.  Neck: Normal range of motion. Neck supple. Carotid bruit is not present.  Cardiovascular: Normal rate, regular rhythm, normal heart sounds, intact distal pulses and normal pulses.   Pulmonary/Chest: Breath sounds normal. No respiratory distress.  Course breath sounds. No wheezes. Symmetrical rise and fall of the chest. Pt speaks in complete sentences.  Abdominal: Soft. Bowel sounds are normal. There  is no tenderness. There is no guarding.  Musculoskeletal: Normal range of motion.  Lymphadenopathy:       Head (right side): No submandibular adenopathy present.       Head (left side): No submandibular adenopathy present.    He has no cervical adenopathy.  Neurological: He is alert and oriented to person, place, and time. He has normal strength. No cranial nerve deficit or  sensory deficit.  Skin: Skin is warm and dry.  Psychiatric: He has a normal mood and affect. His speech is normal.    ED Course  Procedures (including critical care time) Labs Review Labs Reviewed - No data to display  Imaging Review Dg Chest 2 View  04/13/2014   CLINICAL DATA:  Productive cough and fever for 4 days  EXAM: CHEST  2 VIEW  COMPARISON:  PA and lateral chest of February 06, 2014  FINDINGS: The lungs are adequately inflated. There is no focal infiltrate. The interstitial markings are coarse though stable. The heart and pulmonary vascularity appear normal. The mediastinum is normal in width. There is no pleural effusion. The bony thorax is unremarkable.  IMPRESSION: There is no evidence of pneumonia. One cannot exclude acute bronchitis in the appropriate clinical setting.   Electronically Signed   By: David  [place]   On: 04/13/2014 08:42     EKG Interpretation None      MDM Vital signs are well within normal limits. Pulse oximetry is 100% on room air. Within normal limits by my interpretation. Chest x-ray shows no evidence of pneumonia. There are coarse interstitial markings throughout the lung fields, patient has a history of asthma.  The plan at this time is for the patient to be placed on Claritin-D and prednisone. The patient is to continue his albuterol and Advair as prescribed. He is to use Tylenol or ibuprofen for fever or aching. He is to return to the emergency department if any emergent changes, problems, or concerns.    Final diagnoses:  None    *I have reviewed nursing notes, vital signs, and all appropriate lab and imaging results for this patient.Kathie Dike, PA-C 04/13/14 1020

## 2014-04-13 NOTE — ED Notes (Signed)
Productive cough, yellow in color began Monday. States wheezing began last night with hx of asthma. States "puffer" is not helping. States he did run a fever the other night. States his brother has had same symptoms x 2 weeks. NAD

## 2014-04-13 NOTE — Discharge Instructions (Signed)
Your chest x-ray is negative for pneumonia or any acute lung problem. Your vital signs are within normal limits. Please increase water and juices. Please continue your Advair and albuterol as prescribed. Please use Claritin-D every 12 hours or 2 times daily for congestion and cough. Please use prednisone taper as prescribed, please take this with food. Use Tylenol every 4 hours or ibuprofen every 6 hours for fever, aching, or chills. Please see your primary physician, or return to the emergency department if any emergent changes. Sinusitis Sinusitis is redness, soreness, and inflammation of the paranasal sinuses. Paranasal sinuses are air pockets within the bones of your face (beneath the eyes, the middle of the forehead, or above the eyes). In healthy paranasal sinuses, mucus is able to drain out, and air is able to circulate through them by way of your nose. However, when your paranasal sinuses are inflamed, mucus and air can become trapped. This can allow bacteria and other germs to grow and cause infection. Sinusitis can develop quickly and last only a short time (acute) or continue over a long period (chronic). Sinusitis that lasts for more than 12 weeks is considered chronic.  CAUSES  Causes of sinusitis include:  Allergies.  Structural abnormalities, such as displacement of the cartilage that separates your nostrils (deviated septum), which can decrease the air flow through your nose and sinuses and affect sinus drainage.  Functional abnormalities, such as when the small hairs (cilia) that line your sinuses and help remove mucus do not work properly or are not present. SIGNS AND SYMPTOMS  Symptoms of acute and chronic sinusitis are the same. The primary symptoms are pain and pressure around the affected sinuses. Other symptoms include:  Upper toothache.  Earache.  Headache.  Bad breath.  Decreased sense of smell and taste.  A cough, which worsens when you are lying  flat.  Fatigue.  Fever.  Thick drainage from your nose, which often is green and may contain pus (purulent).  Swelling and warmth over the affected sinuses. DIAGNOSIS  Your health care provider will perform a physical exam. During the exam, your health care provider may:  Look in your nose for signs of abnormal growths in your nostrils (nasal polyps).  Tap over the affected sinus to check for signs of infection.  View the inside of your sinuses (endoscopy) using an imaging device that has a light attached (endoscope). If your health care provider suspects that you have chronic sinusitis, one or more of the following tests may be recommended:  Allergy tests.  Nasal culture. A sample of mucus is taken from your nose, sent to a lab, and screened for bacteria.  Nasal cytology. A sample of mucus is taken from your nose and examined by your health care provider to determine if your sinusitis is related to an allergy. TREATMENT  Most cases of acute sinusitis are related to a viral infection and will resolve on their own within 10 days. Sometimes medicines are prescribed to help relieve symptoms (pain medicine, decongestants, nasal steroid sprays, or saline sprays).  However, for sinusitis related to a bacterial infection, your health care provider will prescribe antibiotic medicines. These are medicines that will help kill the bacteria causing the infection.  Rarely, sinusitis is caused by a fungal infection. In theses cases, your health care provider will prescribe antifungal medicine. For some cases of chronic sinusitis, surgery is needed. Generally, these are cases in which sinusitis recurs more than 3 times per year, despite other treatments. HOME CARE INSTRUCTIONS  Drink plenty of water. Water helps thin the mucus so your sinuses can drain more easily.  Use a humidifier.  Inhale steam 3 to 4 times a day (for example, sit in the bathroom with the shower running).  Apply a warm,  moist washcloth to your face 3 to 4 times a day, or as directed by your health care provider.  Use saline nasal sprays to help moisten and clean your sinuses.  Take medicines only as directed by your health care provider.  If you were prescribed either an antibiotic or antifungal medicine, finish it all even if you start to feel better. SEEK IMMEDIATE MEDICAL CARE IF:  You have increasing pain or severe headaches.  You have nausea, vomiting, or drowsiness.  You have swelling around your face.  You have vision problems.  You have a stiff neck.  You have difficulty breathing. MAKE SURE YOU:   Understand these instructions.  Will watch your condition.  Will get help right away if you are not doing well or get worse. Document Released: 07/29/2005 Document Revised: 12/13/2013 Document Reviewed: 08/13/2011 [facility] Patient Information 2015 [place], [place]. This information is not intended to replace advice given to you by your health care provider. Make sure you discuss any questions you have with your health care provider.  Upper Respiratory Infection, Adult An upper respiratory infection (URI) is also sometimes known as the common cold. The upper respiratory tract includes the nose, sinuses, throat, trachea, and bronchi. Bronchi are the airways leading to the lungs. Most people improve within 1 week, but symptoms can last up to 2 weeks. A residual cough may last even longer.  CAUSES Many different viruses can infect the tissues lining the upper respiratory tract. The tissues become irritated and inflamed and often become very moist. Mucus production is also common. A cold is contagious. You can easily spread the virus to others by oral contact. This includes kissing, sharing a glass, coughing, or sneezing. Touching your mouth or nose and then touching a surface, which is then touched by another person, can also spread the virus. SYMPTOMS  Symptoms typically develop 1 to 3 days after  you come in contact with a cold virus. Symptoms vary from person to person. They may include:  Runny nose.  Sneezing.  Nasal congestion.  Sinus irritation.  Sore throat.  Loss of voice (laryngitis).  Cough.  Fatigue.  Muscle aches.  Loss of appetite.  Headache.  Low-grade fever. DIAGNOSIS  You might diagnose your own cold based on familiar symptoms, since most people get a cold 2 to 3 times a year. Your caregiver can confirm this based on your exam. Most importantly, your caregiver can check that your symptoms are not due to another disease such as strep throat, sinusitis, pneumonia, asthma, or epiglottitis. Blood tests, throat tests, and X-rays are not necessary to diagnose a common cold, but they may sometimes be helpful in excluding other more serious diseases. Your caregiver will decide if any further tests are required. RISKS AND COMPLICATIONS  You may be at risk for a more severe case of the common cold if you smoke cigarettes, have chronic heart disease (such as heart failure) or lung disease (such as asthma), or if you have a weakened immune system. The very young and very old are also at risk for more serious infections. Bacterial sinusitis, middle ear infections, and bacterial pneumonia can complicate the common cold. The common cold can worsen asthma and chronic obstructive pulmonary disease (COPD). Sometimes, these complications can  require emergency medical care and may be life-threatening. PREVENTION  The best way to protect against getting a cold is to practice good hygiene. Avoid oral or hand contact with people with cold symptoms. Wash your hands often if contact occurs. There is no clear evidence that vitamin C, vitamin E, echinacea, or exercise reduces the chance of developing a cold. However, it is always recommended to get plenty of rest and practice good nutrition. TREATMENT  Treatment is directed at relieving symptoms. There is no cure. Antibiotics are not  effective, because the infection is caused by a virus, not by bacteria. Treatment may include:  Increased fluid intake. Sports drinks offer valuable electrolytes, sugars, and fluids.  Breathing heated mist or steam (vaporizer or shower).  Eating chicken soup or other clear broths, and maintaining good nutrition.  Getting plenty of rest.  Using gargles or lozenges for comfort.  Controlling fevers with ibuprofen or acetaminophen as directed by your caregiver.  Increasing usage of your inhaler if you have asthma. Zinc gel and zinc lozenges, taken in the first 24 hours of the common cold, can shorten the duration and lessen the severity of symptoms. Pain medicines may help with fever, muscle aches, and throat pain. A variety of non-prescription medicines are available to treat congestion and runny nose. Your caregiver can make recommendations and may suggest nasal or lung inhalers for other symptoms.  HOME CARE INSTRUCTIONS   Only take over-the-counter or prescription medicines for pain, discomfort, or fever as directed by your caregiver.  Use a warm mist humidifier or inhale steam from a shower to increase air moisture. This may keep secretions moist and make it easier to breathe.  Drink enough water and fluids to keep your urine clear or pale yellow.  Rest as needed.  Return to work when your temperature has returned to normal or as your caregiver advises. You may need to stay home longer to avoid infecting others. You can also use a face mask and careful hand washing to prevent spread of the virus. SEEK MEDICAL CARE IF:   After the first few days, you feel you are getting worse rather than better.  You need your caregiver's advice about medicines to control symptoms.  You develop chills, worsening shortness of breath, or brown or red sputum. These may be signs of pneumonia.  You develop yellow or brown nasal discharge or pain in the face, especially when you bend forward. These may  be signs of sinusitis.  You develop a fever, swollen neck glands, pain with swallowing, or white areas in the back of your throat. These may be signs of strep throat. SEEK IMMEDIATE MEDICAL CARE IF:   You have a fever.  You develop severe or persistent headache, ear pain, sinus pain, or chest pain.  You develop wheezing, a prolonged cough, cough up blood, or have a change in your usual mucus (if you have chronic lung disease).  You develop sore muscles or a stiff neck. Document Released: 01/22/2001 Document Revised: 10/21/2011 Document Reviewed: 11/03/2013 [facility] Patient Information 2015 [place], [place]. This information is not intended to replace advice given to you by your health care provider. Make sure you discuss any questions you have with your health care provider.

## 2014-04-13 NOTE — ED Provider Notes (Signed)
Medical screening examination/treatment/procedure(s) were performed by non-physician practitioner and as supervising physician I was immediately available for consultation/collaboration.   EKG Interpretation None        [place] Jaquith L Lamondre Wesche, MD 04/13/14 1537 

## 2014-04-13 NOTE — ED Notes (Signed)
PA at bedside.

## 2014-04-20 ENCOUNTER — Encounter: Payer: Self-pay | Admitting: Family Medicine

## 2014-04-20 ENCOUNTER — Ambulatory Visit (INDEPENDENT_AMBULATORY_CARE_PROVIDER_SITE_OTHER): Payer: BC Managed Care – PPO | Admitting: Family Medicine

## 2014-04-20 VITALS — BP 132/76 | Temp 97.5°F | Ht 66.5 in | Wt 246.0 lb

## 2014-04-20 DIAGNOSIS — J4520 Mild intermittent asthma, uncomplicated: Secondary | ICD-10-CM

## 2014-04-20 DIAGNOSIS — J45909 Unspecified asthma, uncomplicated: Secondary | ICD-10-CM

## 2014-04-20 DIAGNOSIS — J019 Acute sinusitis, unspecified: Secondary | ICD-10-CM

## 2014-04-20 DIAGNOSIS — B9689 Other specified bacterial agents as the cause of diseases classified elsewhere: Secondary | ICD-10-CM

## 2014-04-20 MED ORDER — CLARITHROMYCIN 500 MG PO TABS: 500.0000 mg | ORAL_TABLET | Freq: Two times a day (BID) | ORAL | Status: DC

## 2014-04-20 MED ORDER — HYDROCODONE-HOMATROPINE 5-1.5 MG/5ML PO SYRP: 5.0000 mL | ORAL_SOLUTION | Freq: Every evening | ORAL | Status: DC | PRN

## 2014-04-20 NOTE — Progress Notes (Signed)
   Subjective:    Patient ID: Travis Aguilar, male    DOB: Apr 08, 1991, 23 y.o.   MRN: [id number]  Cough This is a new problem. The current episode started 1 to 4 weeks ago. Associated symptoms include a fever and headaches. Associated symptoms comments: Runny nose.   Was seen at ED on 04/13/14 and prescribed predisone. Pt states he stopped the predisone because it was not helping.   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  Respiratory: Positive for cough.   Neurological: Positive for headaches.       Objective:   Physical Exam  Alert mild malaise. Vital stable. Lungs clear. Heart regular in rhythm. HET moderate nasal frontal maxillary tenderness. Lungs no wheezes currently.      Assessment & Plan:  Impression acute rhinosinusitis with recent flare of asthma. Asthma now call and down. Plan Ventolin only when necessary. Maintain Advair. Antibiotics prescribed. Hycodan each bedtime when necessary. WSL

## 2014-06-25 ENCOUNTER — Other Ambulatory Visit: Payer: Self-pay | Admitting: Family Medicine

## 2014-07-09 ENCOUNTER — Other Ambulatory Visit: Payer: Self-pay | Admitting: Family Medicine

## 2014-08-09 ENCOUNTER — Ambulatory Visit (INDEPENDENT_AMBULATORY_CARE_PROVIDER_SITE_OTHER): Payer: BC Managed Care – PPO | Admitting: Family Medicine

## 2014-08-09 ENCOUNTER — Encounter: Payer: Self-pay | Admitting: Family Medicine

## 2014-08-09 VITALS — BP 122/86 | Ht 66.5 in | Wt 255.0 lb

## 2014-08-09 DIAGNOSIS — M545 Low back pain, unspecified: Secondary | ICD-10-CM

## 2014-08-09 MED ORDER — ETODOLAC 400 MG PO TABS: 400.0000 mg | ORAL_TABLET | Freq: Two times a day (BID) | ORAL | Status: DC | PRN

## 2014-08-09 NOTE — Patient Instructions (Signed)
Low back exercise a very good idea about ten minutes four times per week  Back Exercises Back exercises help treat and prevent back injuries. The goal of back exercises is to increase the strength of your abdominal and back muscles and the flexibility of your back. These exercises should be started when you no longer have back pain. Back exercises include:  Pelvic Tilt. Lie on your back with your knees bent. Tilt your pelvis until the lower part of your back is against the floor. Hold this position 5 to 10 sec and repeat 5 to 10 times.  Knee to Chest. Pull first 1 knee up against your chest and hold for 20 to 30 seconds, repeat this with the other knee, and then both knees. This may be done with the other leg straight or bent, whichever feels better.  Sit-Ups or Curl-Ups. Bend your knees 90 degrees. Start with tilting your pelvis, and do a partial, slow sit-up, lifting your trunk only 30 to 45 degrees off the floor. Take at least 2 to 3 seconds for each sit-up. Do not do sit-ups with your knees out straight. If partial sit-ups are difficult, simply do the above but with only tightening your abdominal muscles and holding it as directed.  Hip-Lift. Lie on your back with your knees flexed 90 degrees. Push down with your feet and shoulders as you raise your hips a couple inches off the floor; hold for 10 seconds, repeat 5 to 10 times.  Back arches. Lie on your stomach, propping yourself up on bent elbows. Slowly press on your hands, causing an arch in your low back. Repeat 3 to 5 times. Any initial stiffness and discomfort should lessen with repetition over time.  Shoulder-Lifts. Lie face down with arms beside your body. Keep hips and torso pressed to floor as you slowly lift your head and shoulders off the floor. Do not overdo your exercises, especially in the beginning. Exercises may cause you some mild back discomfort which lasts for a few minutes; however, if the pain is more severe, or lasts for  more than 15 minutes, do not continue exercises until you see your caregiver. Improvement with exercise therapy for back problems is slow.  See your caregivers for assistance with developing a proper back exercise program. Document Released: 09/05/2004 Document Revised: 10/21/2011 Document Reviewed: 05/30/2011 [facility] Patient Information 2015 [place] [place]. This information is not intended to replace advice given to you by your health care provider. Make sure you discuss any questions you have with your health care provider.

## 2014-08-09 NOTE — Progress Notes (Signed)
   Subjective:    Patient ID: Travis Aguilar, male    DOB: [birth year], 23 y.o.   MRN: [id number]  Back Pain This is a new problem. Episode onset: May. Was seen by Dr. Lorin PicketScott. The problem occurs intermittently. The problem has been gradually improving since onset. The pain is present in the gluteal. The quality of the pain is described as stabbing. The pain does not radiate. The pain is worse during the night. The symptoms are aggravated by twisting and lying down. He has tried muscle relaxant for the symptoms. The treatment provided mild relief.   . Intermittent wheezing. Generally under good control. Also here for an Asthma check up   Chronic low back pain. Midline. Has been going on for quite a few months now. Recalls no sudden injury.  Review of Systems  Musculoskeletal: Positive for back pain.   No abdominal pain no chest pain no change in urinary or bowel habits    Objective:   Physical Exam  Alert no apparent distress H&T normal lungs clear neck supple heart regular rhythm low back perhaps some tenderness to deep percussion negative straight leg raise      Assessment & Plan:  Impression 1 chronic back pain discussed #2 asthma clinically stable plan medicines refilled. X-ray were further recommendations based results. Regular exercise discussed in encourage. WSL

## 2014-08-10 ENCOUNTER — Ambulatory Visit (HOSPITAL_COMMUNITY)
Admission: RE | Admit: 2014-08-10 | Discharge: 2014-08-10 | Disposition: A | Discharge status: Home / Self Care | Payer: BC Managed Care – PPO | Source: Ambulatory Visit | Attending: Family Medicine | Admitting: Family Medicine

## 2014-08-10 DIAGNOSIS — M545 Low back pain, unspecified: Secondary | ICD-10-CM

## 2014-08-10 DIAGNOSIS — R937 Abnormal findings on diagnostic imaging of other parts of musculoskeletal system: Secondary | ICD-10-CM | POA: Diagnosis not present

## 2014-08-11 NOTE — Progress Notes (Signed)
Patient notified and verbalized understanding. I transferred up front to schedule and appt with Dr. Brett CanalesSteve next week.

## 2014-08-18 ENCOUNTER — Ambulatory Visit (INDEPENDENT_AMBULATORY_CARE_PROVIDER_SITE_OTHER): Payer: BLUE CROSS/BLUE SHIELD | Admitting: Family Medicine

## 2014-08-18 ENCOUNTER — Encounter: Payer: Self-pay | Admitting: Family Medicine

## 2014-08-18 VITALS — BP 138/82 | Ht 66.5 in | Wt 255.0 lb

## 2014-08-18 DIAGNOSIS — M545 Low back pain, unspecified: Secondary | ICD-10-CM

## 2014-08-18 DIAGNOSIS — M4306 Spondylolysis, lumbar region: Secondary | ICD-10-CM

## 2014-08-18 NOTE — Progress Notes (Signed)
   Subjective:    Patient ID: Travis CarwinSteven Bubb, male    DOB: November 07, 1990, 24 y.o.   MRN: [id number]  HPI  Patient arrives to discuss the results of recent back xray. See prior notes. Significant lumbar pain. Occurred initially when twisting and lifting heavy object   Pain occurs off-and-on  Review of Systems No headache no chest pain no abdominal pain no change in bowel habits    Objective:   Physical Exam  Alert no acute distress. Lungs clear. Heart regular in rhythm. Low back tender to percussion      Assessment & Plan:  Impression chronic back pain secondary to pars defect injury. Nature of injury discussed. Plan regular exercise. Anti-inflammatory when necessary. WSL

## 2014-08-18 NOTE — Patient Instructions (Signed)
This is called a pars defect, which is a mild type of fracture of a small part of a backbone, though it hurts, regular stretching and time should calm it down,

## 2014-08-30 ENCOUNTER — Other Ambulatory Visit: Payer: Self-pay | Admitting: Family Medicine

## 2014-09-16 ENCOUNTER — Telehealth: Payer: Self-pay | Admitting: Family Medicine

## 2014-09-16 NOTE — Telephone Encounter (Signed)
ERROR

## 2014-09-19 ENCOUNTER — Other Ambulatory Visit: Payer: Self-pay | Admitting: Family Medicine

## 2014-09-20 NOTE — Telephone Encounter (Signed)
Last seen 08/18/14

## 2014-09-23 ENCOUNTER — Encounter (HOSPITAL_COMMUNITY): Payer: Self-pay | Admitting: Cardiology

## 2014-09-23 ENCOUNTER — Emergency Department (HOSPITAL_COMMUNITY)
Admission: EM | Admit: 2014-09-23 | Discharge: 2014-09-23 | Disposition: A | Discharge status: Home / Self Care | Payer: BLUE CROSS/BLUE SHIELD | Attending: Emergency Medicine | Admitting: Emergency Medicine

## 2014-09-23 DIAGNOSIS — K529 Noninfective gastroenteritis and colitis, unspecified: Secondary | ICD-10-CM | POA: Diagnosis not present

## 2014-09-23 DIAGNOSIS — Z880 Allergy status to penicillin: Secondary | ICD-10-CM | POA: Diagnosis not present

## 2014-09-23 DIAGNOSIS — R1110 Vomiting, unspecified: Secondary | ICD-10-CM | POA: Diagnosis present

## 2014-09-23 DIAGNOSIS — Z79899 Other long term (current) drug therapy: Secondary | ICD-10-CM | POA: Insufficient documentation

## 2014-09-23 DIAGNOSIS — J45909 Unspecified asthma, uncomplicated: Secondary | ICD-10-CM | POA: Diagnosis not present

## 2014-09-23 LAB — CBC WITH DIFFERENTIAL/PLATELET
Basophils Absolute: 0 10*3/uL (ref 0.0–0.1)
Basophils Relative: 0 % (ref 0–1)
EOS PCT: 0 % (ref 0–5)
Eosinophils Absolute: 0.1 10*3/uL (ref 0.0–0.7)
HEMATOCRIT: 45.5 % (ref 39.0–52.0)
HEMOGLOBIN: 15.4 g/dL (ref 13.0–17.0)
LYMPHS ABS: 0.7 10*3/uL (ref 0.7–4.0)
Lymphocytes Relative: 4 % — ABNORMAL LOW (ref 12–46)
MCH: 30.7 pg (ref 26.0–34.0)
MCHC: 33.8 g/dL (ref 30.0–36.0)
MCV: 90.6 fL (ref 78.0–100.0)
MONOS PCT: 5 % (ref 3–12)
Monocytes Absolute: 0.8 10*3/uL (ref 0.1–1.0)
Neutro Abs: 14.9 10*3/uL — ABNORMAL HIGH (ref 1.7–7.7)
Neutrophils Relative %: 90 % — ABNORMAL HIGH (ref 43–77)
Platelets: 345 10*3/uL (ref 150–400)
RBC: 5.02 MIL/uL (ref 4.22–5.81)
RDW: 13.8 % (ref 11.5–15.5)
WBC: 16.4 10*3/uL — ABNORMAL HIGH (ref 4.0–10.5)

## 2014-09-23 LAB — BASIC METABOLIC PANEL
Anion gap: 8 (ref 5–15)
BUN: 19 mg/dL (ref 6–23)
CHLORIDE: 105 mmol/L (ref 96–112)
CO2: 25 mmol/L (ref 19–32)
Calcium: 9.2 mg/dL (ref 8.4–10.5)
Creatinine, Ser: 0.69 mg/dL (ref 0.50–1.35)
Glucose, Bld: 122 mg/dL — ABNORMAL HIGH (ref 70–99)
Potassium: 3.9 mmol/L (ref 3.5–5.1)
Sodium: 138 mmol/L (ref 135–145)

## 2014-09-23 MED ORDER — SODIUM CHLORIDE 0.9 % IV BOLUS (SEPSIS)
1000.0000 mL | Freq: Once | INTRAVENOUS | Status: AC
  Administered 2014-09-23: 1000 mL via INTRAVENOUS

## 2014-09-23 MED ORDER — PROMETHAZINE HCL 25 MG PO TABS: 25.0000 mg | ORAL_TABLET | Freq: Four times a day (QID) | ORAL | Status: DC | PRN

## 2014-09-23 MED ORDER — SODIUM CHLORIDE 0.9 % IV SOLN: INTRAVENOUS | Status: DC

## 2014-09-23 MED ORDER — ONDANSETRON HCL 4 MG/2ML IJ SOLN
4.0000 mg | Freq: Once | INTRAMUSCULAR | Status: AC
  Administered 2014-09-23: 4 mg via INTRAVENOUS

## 2014-09-23 MED ORDER — ONDANSETRON HCL 4 MG/2ML IJ SOLN
4.0000 mg | Freq: Once | INTRAMUSCULAR | Status: DC
  Filled 2014-09-23: qty 2

## 2014-09-23 MED ORDER — SODIUM CHLORIDE 0.9 % IV SOLN
Freq: Once | INTRAVENOUS | Status: AC
  Administered 2014-09-23: 08:00:00 via INTRAVENOUS

## 2014-09-23 MED ORDER — ONDANSETRON 4 MG PO TBDP: 4.0000 mg | ORAL_TABLET | Freq: Three times a day (TID) | ORAL | Status: DC | PRN

## 2014-09-23 NOTE — ED Provider Notes (Signed)
CSN: [id number]     Arrival date & time 09/23/14  0710 History  This chart was scribed for Vanetta MuldersScott Corin Formisano, MD by Littie Deedsichard Sun, ED Scribe. This patient was seen in room APA09/APA09 and the patient's care was started at 8:06 AM.       Chief Complaint  Patient presents with  . Emesis   Patient is a 24 y.o. male presenting with vomiting. The history is provided by the patient. No language interpreter was used.  Emesis Duration:  6 hours Timing:  Intermittent Progression:  Unchanged Chronicity:  New Context: not self-induced   Relieved by:  Nothing Worsened by:  Nothing tried Ineffective treatments:  None tried Associated symptoms: abdominal pain, chills and diarrhea   Associated symptoms: no headaches, no myalgias and no sore throat    HPI Comments: Travis Aguilar is a 24 y.o. male who presents to the Emergency Department complaining of several episodes of vomiting that started around 2:00 AM this morning. He also reports having several episodes of diarrhea in conjunction with the vomiting, abdominal pain, chills and lightheadedness. Patient has taken Pepto-Bismol for his symptoms. He denies myalgias and HA.   Past Medical History  Diagnosis Date  . Asthma    Past Surgical History  Procedure Laterality Date  . Tonsillectomy    . Surgery of lip      age 24 or [age]   Family History  Problem Relation Age of Onset  . Cancer Other     breast   History  Substance Use Topics  . Smoking status: Never Smoker   . Smokeless tobacco: Not on file  . Alcohol Use: No    Review of Systems  Constitutional: Positive for chills. Negative for fever.  HENT: Negative for congestion, rhinorrhea and sore throat.   Eyes: Negative for visual disturbance.  Respiratory: Negative for cough and shortness of breath.   Cardiovascular: Negative for chest pain and leg swelling.  Gastrointestinal: Positive for vomiting, abdominal pain and diarrhea.  Musculoskeletal: Negative for myalgias.  Skin: Negative  for rash.  Neurological: Positive for light-headedness. Negative for headaches.  Hematological: Does not bruise/bleed easily.      Allergies  Amoxil and Penicillins  Home Medications   Prior to Admission medications   Medication Sig Start Date End Date Taking? Authorizing Provider  ADVAIR DISKUS 250-50 MCG/DOSE AEPB INHALE 1 PUFF BY MOUTH EVERY 12 HOURS 08/31/14   Merlyn AlbertWilliam S Luking, MD  etodolac (LODINE) 400 MG tablet TAKE 1 TABLET BY MOUTH TWICE DAILY WITH FOOD AS NEEDED FOR MILD OR MODERATE PAIN 09/20/14   Merlyn AlbertWilliam S Luking, MD  ondansetron (ZOFRAN ODT) 4 MG disintegrating tablet Take 1 tablet (4 mg total) by mouth every 8 (eight) hours as needed. 09/23/14   Vanetta MuldersScott Riely Baskett, MD  promethazine (PHENERGAN) 25 MG tablet Take 1 tablet (25 mg total) by mouth every 6 (six) hours as needed. 09/23/14   Vanetta MuldersScott Syanna Remmert, MD  VENTOLIN HFA 108 (90 BASE) MCG/ACT inhaler INHALE 2 PUFFS BY MOUTH EVERY 6 HOURS AS NEEDED FOR WHEEZING 06/27/14   Merlyn AlbertWilliam S Luking, MD   BP 128/69 mmHg  Pulse 93  Temp(Src) 97.7 F (36.5 C) (Oral)  Resp 18  Ht 5\' 6"  (1.676 m)  Wt 250 lb (113.399 kg)  BMI 40.37 kg/m2  SpO2 99% Physical Exam  Constitutional: He is oriented to person, place, and time. He appears well-developed and well-nourished. No distress.  HENT:  Head: Normocephalic and atraumatic.  Mouth/Throat: Oropharynx is clear and moist. No oropharyngeal exudate.  Eyes:  Pupils are equal, round, and reactive to light.  Neck: Neck supple.  Cardiovascular: Normal rate, regular rhythm and normal heart sounds.   No murmur heard. Pulmonary/Chest: Effort normal and breath sounds normal. No respiratory distress. He has no wheezes. He has no rales.  CTAB.  Abdominal: Soft. Bowel sounds are normal. There is no tenderness.  Musculoskeletal: He exhibits no edema.  No ankle swelling.  Neurological: He is alert and oriented to person, place, and time. No cranial nerve deficit.  Skin: Skin is warm and dry. No rash noted.   Psychiatric: He has a normal mood and affect. His behavior is normal.  Nursing note and vitals reviewed.   ED Course  Procedures  DIAGNOSTIC STUDIES: Oxygen Saturation is 96% on room air, adequate by my interpretation.    COORDINATION OF CARE: 8:12 AM-Discussed treatment plan which includes labs and medications with pt at bedside and pt agreed to plan.    Labs Review Labs Reviewed  CBC WITH DIFFERENTIAL/PLATELET - Abnormal; Notable for the following:    WBC 16.4 (*)    Neutrophils Relative % 90 (*)    Neutro Abs 14.9 (*)    Lymphocytes Relative 4 (*)    All other components within normal limits  BASIC METABOLIC PANEL - Abnormal; Notable for the following:    Glucose, Bld 122 (*)    All other components within normal limits   Results for orders placed or performed during the hospital encounter of 09/23/14  CBC with Differential  Result Value Ref Range   WBC 16.4 (H) 4.0 - 10.5 K/uL   RBC 5.02 4.22 - 5.81 MIL/uL   Hemoglobin 15.4 13.0 - 17.0 g/dL   HCT 16.1 09.6 - 04.5 %   MCV 90.6 78.0 - 100.0 fL   MCH 30.7 26.0 - 34.0 pg   MCHC 33.8 30.0 - 36.0 g/dL   RDW 40.9 81.1 - 91.4 %   Platelets 345 150 - 400 K/uL   Neutrophils Relative % 90 (H) 43 - 77 %   Neutro Abs 14.9 (H) 1.7 - 7.7 K/uL   Lymphocytes Relative 4 (L) 12 - 46 %   Lymphs Abs 0.7 0.7 - 4.0 K/uL   Monocytes Relative 5 3 - 12 %   Monocytes Absolute 0.8 0.1 - 1.0 K/uL   Eosinophils Relative 0 0 - 5 %   Eosinophils Absolute 0.1 0.0 - 0.7 K/uL   Basophils Relative 0 0 - 1 %   Basophils Absolute 0.0 0.0 - 0.1 K/uL  Basic metabolic panel  Result Value Ref Range   Sodium 138 135 - 145 mmol/L   Potassium 3.9 3.5 - 5.1 mmol/L   Chloride 105 96 - 112 mmol/L   CO2 25 19 - 32 mmol/L   Glucose, Bld 122 (H) 70 - 99 mg/dL   BUN 19 6 - 23 mg/dL   Creatinine, Ser 7.82 0.50 - 1.35 mg/dL   Calcium 9.2 8.4 - 95.6 mg/dL   GFR calc non Af Amer >90 >90 mL/min   GFR calc Af Amer >90 >90 mL/min   Anion gap 8 5 - 15      Imaging Review No results found.   EKG Interpretation None      MDM   Final diagnoses:  Gastroenteritis    Patient with acute onset of vomiting diarrhea illness most likely viral gastroenteritis. Started at the 2 in the morning. Patient's had multiple episodes of diarrhea multiple episodes of vomiting. Improved here with some fluids patient received total of 2  L of normal saline. Also given Zofran. Patient will be treated symptomatically with Phenergan and Zofran for home and work note. Patient nontoxic no acute distress. Abdomen without any significant tenderness.    I personally performed the services described in this documentation, which was scribed in my presence. The recorded information has been reviewed and is accurate.     Vanetta Mulders, MD 09/23/14 [phone]

## 2014-09-23 NOTE — ED Notes (Signed)
Vomiting and diarrhea since 1 am.

## 2014-09-23 NOTE — Discharge Instructions (Signed)
Small amounts of fluids today as we discussed. Then advance to a bland diet. Take the antinausea medicine. Would expect you to be improving in 24 hours. The diarrhea may last a little bit longer. Work note provided. Return for any new or worse symptoms.

## 2014-10-24 ENCOUNTER — Ambulatory Visit (INDEPENDENT_AMBULATORY_CARE_PROVIDER_SITE_OTHER): Payer: BLUE CROSS/BLUE SHIELD | Admitting: Family Medicine

## 2014-10-24 ENCOUNTER — Encounter: Payer: Self-pay | Admitting: Family Medicine

## 2014-10-24 VITALS — BP 100/76 | Ht 66.5 in | Wt 253.2 lb

## 2014-10-24 DIAGNOSIS — M4306 Spondylolysis, lumbar region: Secondary | ICD-10-CM | POA: Diagnosis not present

## 2014-10-24 MED ORDER — NABUMETONE 750 MG PO TABS: 750.0000 mg | ORAL_TABLET | Freq: Two times a day (BID) | ORAL | Status: DC

## 2014-10-24 NOTE — Progress Notes (Signed)
   Subjective:    Patient ID: Travis Aguilar, male    DOB: 03/04/1991, 24 y.o.   MRN: [id number]  Back Pain This is a recurrent problem. The current episode started more than 1 month ago. The problem occurs intermittently. The problem is unchanged. The pain is present in the lumbar spine. The quality of the pain is described as aching. The pain radiates to the right thigh and left thigh. The pain is moderate. The pain is the same all the time. Stiffness is present all day. He has tried NSAIDs for the symptoms. The treatment provided no relief.   Patient has no other concerns at this time.   Ongoing severe pain at times. Optimizing ability to work. Lodine has helped some but not a lot.   Review of Systems  Musculoskeletal: Positive for back pain.      no headache no chest pain no abdominal pain Objective:   Physical Exam  Alert no acute distress. Lungs clear heart regular rhythm low back tender to percussion negative straight leg raise      Assessment & Plan:  Impression pars defect with persistent pain. Plan orthopedic referral. Rationale discussed. Change anti-inflammatory medicine. Local measures discussed WSL

## 2014-10-25 ENCOUNTER — Other Ambulatory Visit: Payer: Self-pay | Admitting: *Deleted

## 2014-10-25 DIAGNOSIS — M4300 Spondylolysis, site unspecified: Secondary | ICD-10-CM

## 2014-11-08 ENCOUNTER — Telehealth: Payer: Self-pay | Admitting: Family Medicine

## 2014-11-08 MED ORDER — NABUMETONE 750 MG PO TABS: 750.0000 mg | ORAL_TABLET | Freq: Two times a day (BID) | ORAL | Status: DC

## 2014-11-08 NOTE — Telephone Encounter (Signed)
Rx sent electronically to pharmacy. Patient notified. 

## 2014-11-08 NOTE — Telephone Encounter (Signed)
Refill 2 please

## 2014-11-08 NOTE — Telephone Encounter (Signed)
[facility] pt. Seen on 3/14 and given rx for Relafen. Ortho referral placed.

## 2014-11-08 NOTE — Telephone Encounter (Signed)
nabumetone (RELAFEN) 750 MG tablet  [facility]   Refill please

## 2015-01-04 ENCOUNTER — Ambulatory Visit (HOSPITAL_COMMUNITY): Payer: BLUE CROSS/BLUE SHIELD | Attending: Physical Medicine and Rehabilitation | Admitting: Physical Therapy

## 2015-01-04 DIAGNOSIS — M545 Low back pain: Secondary | ICD-10-CM | POA: Diagnosis not present

## 2015-01-04 DIAGNOSIS — M5440 Lumbago with sciatica, unspecified side: Secondary | ICD-10-CM

## 2015-01-04 DIAGNOSIS — X500XXS Overexertion from strenuous movement or load, sequela: Secondary | ICD-10-CM

## 2015-01-04 DIAGNOSIS — R29898 Other symptoms and signs involving the musculoskeletal system: Secondary | ICD-10-CM | POA: Diagnosis not present

## 2015-01-04 DIAGNOSIS — M6281 Muscle weakness (generalized): Secondary | ICD-10-CM | POA: Diagnosis not present

## 2015-01-04 DIAGNOSIS — X503XXS Overexertion from repetitive movements, sequela: Secondary | ICD-10-CM

## 2015-01-04 DIAGNOSIS — M25652 Stiffness of left hip, not elsewhere classified: Secondary | ICD-10-CM | POA: Diagnosis not present

## 2015-01-04 NOTE — Therapy (Signed)
[facility] [address] [place], [place], [address] Phone: [phone]   Fax:  [phone]  Physical Therapy Evaluation  Patient Details  Name: Travis Aguilar MRN: [id number] Date of Birth: 1990/12/13 Referring Provider:  Sheran Luz, MD  Encounter Date: 01/04/2015      PT End of Session - 01/04/15 1918    Visit Number 1   Number of Visits 16   Date for PT Re-Evaluation 02/03/15   Authorization Type BCBS   Authorization - Visit Number 1   Authorization - Number of Visits 16   PT Start Time 1750   PT Stop Time 1844   PT Time Calculation (min) 54 min   Activity Tolerance Patient tolerated treatment well   Behavior During Therapy [facility] for tasks assessed/performed      Past Medical History  Diagnosis Date  . Asthma     Past Surgical History  Procedure Laterality Date  . Tonsillectomy    . Surgery of lip      age 24 or 24    There were no vitals filed for this visit.  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 with radiating pain down bilateral legs inconsistently. no improvements ove rthe last year. noted no relief with rest or pain medication. pain predominantly with bending over, pain with prolonged sleeping wihtotu back support.    How long can you stand comfortably? no pain.    How long can you walk comfortably? no pain with walking   Patient Stated Goals to decrease  pain, to be able to be stonger to lift > 100lb to lift to chest height.    Currently in Pain? Yes   Pain Score 2    Pain Location Back   Pain Orientation Left;Right;Posterior   Pain Descriptors / Indicators Aching;Burning   Pain Type Chronic pain   Pain Radiating Towards legs   Pain Onset More than a month ago   Pain Frequency Constant   Aggravating Factors  prolonged bending, lifting, minimal with prolonged sitting, pain while lifting objects above shoulder height,    Pain Relieving Factors heat, bolser under the back for sleepign, lumbar spine bolster. back brace.             [facility] PT Assessment - 01/04/15 0001    Assessment   Medical Diagnosis back pain,    Onset Date/Surgical Date 11/04/14   Next MD Visit Ardyth Gal   Prior Therapy No   Balance Screen   Has the patient fallen in the past 6 months No   Has the patient had a decrease in activity level because of a fear of falling?  No   Is the patient reluctant to leave their home because of a fear of falling?  No   Prior Function   Level of Independence Independent   Observation/Other Assessments   Focus on Therapeutic Outcomes (FOTO)  27% limited  Functional Tests   Functional tests Other;Other2;Single Leg Squat;Single leg stance   Single Leg Squat   Comments Limited stability n lt indicating increased Lt lE weakness.    Other:   Other/ Comments Lt LE single leg stance  with Rt leg toe touch UE reach to floor to over head reach  resuls in increased Lt low back pain, no pain with performance on Rt.    Other:   Other/Comments 3D hip excursions: with with extension on Lt,    ROM / Strength   AROM / PROM / Strength AROM;Strength   AROM   AROM Assessment Site Ankle;Knee;Hip;Lumbar;Thoracic   Right/Left Hip Right;Left   Right Hip Flexion 120   Right Hip External Rotation  45   Right Hip Internal Rotation  35   Left Hip Flexion 90   Left Hip External Rotation  45   Left Hip Internal Rotation  25   Right/Left Knee  Right;Left   Right/Left Ankle Right;Left   Right Ankle Dorsiflexion 5   Left Ankle Dorsiflexion 5   Lumbar Flexion 62   Lumbar Extension 42   Thoracic - Right Rotation 85   Thoracic - Left Rotation 55   Strength   Strength Assessment Site Lumbar;Ankle;Knee;Hip   Right/Left Hip Right;Left   Right Hip Extension 4-/5   Right Hip ABduction 4-/5   Left Hip Extension 4-/5   Left Hip ABduction 4-/5   Right/Left Knee Right;Left   Right Knee Flexion 4/5   Right Knee Extension 4+/5   Left Knee Flexion 4/5   Left Knee Extension 4+/5   Right/Left Ankle Right   Lumbar Flexion 2+/5   Lumbar Extension 3-/5   Flexibility   Soft Tissue Assessment /Muscle Length yes   Hamstrings Rt 80 degrees, Lt 60 degrees   Quadriceps limited more on Lt than Rt   Piriformis limited more on Lt than Rt.    Special Tests    Special Tests Sacrolliac Tests;Lumbar;Hip Special Tests   Hip Special Tests  Other   other   Comments Stork test: Lt innominat excessive anteiro tilt on Lt single elg standing indicating Lt inniminate instability.                    [facility] Adult PT Treatment/Exercise - 01/04/15 0001    Exercises   Exercises Lumbar   Lumbar Exercises: Stretches   Active Hamstring Stretch Limitations 10x 3 seconds 3 way stanidng to 12"    Piriformis Stretch Limitations 10x 3 second hold 3 ways seated   Lumbar Exercises: Standing   Other Standing Lumbar Exercises 4 way pick up and reach 10x 3lb   Other Standing Lumbar Exercises --   Lumbar Exercises: Seated   Other Seated Lumbar Exercises 3D thoracic spine excursiosn with UE overhead reaches 10x each                PT Education - 01/04/15 1917    Education provided Yes   Education Details Patient given HEP incldign: 3way piriformis stretch, 3 way hamstring stretch, 4 way pick up and reach, and 3 D thoracic spine excursions    Person(s) Educated Patient   Methods Explanation;Demonstration;Handout   Comprehension Verbalized  understanding;Returned demonstration          PT Short Term Goals - 01/04/15 1927    PT SHORT TERM GOAL #1   Title Patient will be able to demosntrate icnreased thoracic spien rotation too Rt to >70 degrees indicatign improved ability to look over Lt shoulder while  driving.    Time 4   Period Weeks   Status New   PT SHORT TERM GOAL #2   Title Patient willdmoenstrate a negative piriformis test and hamstring ROM of >80 degrees bilaterally with SLR.    Time 4   Period Weeks   Status New   PT SHORT TERM GOAL #3   Title Patint will dmoenstrate increased lumbar flexion fROM > 80 degrees to more easily bend down and touch toes.    Time 4   Period Weeks   PT SHORT TERM GOAL #4   Title Patient will be indepenedent with HEP   Time 4   Period Weeks   Status New           PT Long Term Goals - 01/04/15 1930    PT LONG TERM GOAL #1   Title Patient will demonstrate icnreased glute max strength of 5//5 to be able to more easily lift heavy objects from te floor through his hips   Time 8   Period Weeks   Status New   PT LONG TERM GOAL #2   Title Patient will demonstrate icnreased hamstring strength of 5//5 to be able to more easily lift heavy objects from the floor through his knees to decrease strain on lumbar spine   Time 8   Period Weeks   Status New   PT LONG TERM GOAL #3   Title patient will dmeonstrate increased abdominal and lumbar paraspinal muscle strength of 5/5 MMT indicatign increased trunk stability for lift items throughtou his lumbar spine in preventing lumbar spien strain   Time 8   Period Weeks   Status New   PT LONG TERM GOAL #4   Title Patiwent will be able to lift 100lb from the floor to chest height 10x in 2 minutes to be able to return to work at 100%.    Time 8   Period Weeks   Status New               Plan - 01/04/15 1919    Clinical Impression Statement Patient displays low back pain thats increased with lifitng , left trunk rotation and trunk  flexion/extension secondary to Lt hip stiffness, Lt thoracici spine rotation limitations, and weakness of abdomial muslces and glutes placing the back under excessive strain with heavy liftign required for patient to perform hjis job. 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     Pt will benefit from skilled therapeutic intervention in order to improve on the following deficits Abnormal gait;Decreased strength;Increased muscle spasms;Improper body mechanics;Difficulty walking;Postural dysfunction;Impaired flexibility;Pain;Hypermobility;Impaired UE functional use;Decreased endurance   Rehab Potential Good   PT Frequency 2x / week   PT Duration 8 weeks   PT Treatment/Interventions Gait training;Functional mobility training;Manual techniques;Therapeutic exercise;Patient/family education   PT Next Visit Plan Introduce 3 way pectoral stretch, posterior shoudler stretch o increase tunk mobility, and overhead dumbbell matrix to increease trunk stability.    PT Home Exercise Plan 3 way piriformis and 3 way hamstring stretch, 3D thoracic spine excursions, 4 way pick up and reach   Consulted and Agree with Plan of Care Patient         Problem List Patient Active Problem  List   Diagnosis Date Noted  . Pars defect of lumbar spine 08/18/2014  . Asthma, mild intermittent 06/24/2013   Jerilee Field PT DPT [phone]  [facility] [address] [place], [place], [address] Phone: [phone]   Fax:  [phone]

## 2015-01-26 ENCOUNTER — Ambulatory Visit (HOSPITAL_COMMUNITY): Payer: BLUE CROSS/BLUE SHIELD | Attending: Physical Medicine and Rehabilitation

## 2015-01-26 DIAGNOSIS — X503XXS Overexertion from repetitive movements, sequela: Secondary | ICD-10-CM

## 2015-01-26 DIAGNOSIS — M6281 Muscle weakness (generalized): Secondary | ICD-10-CM | POA: Diagnosis not present

## 2015-01-26 DIAGNOSIS — M5440 Lumbago with sciatica, unspecified side: Secondary | ICD-10-CM

## 2015-01-26 DIAGNOSIS — M25652 Stiffness of left hip, not elsewhere classified: Secondary | ICD-10-CM | POA: Diagnosis not present

## 2015-01-26 DIAGNOSIS — M545 Low back pain: Secondary | ICD-10-CM | POA: Insufficient documentation

## 2015-01-26 DIAGNOSIS — X500XXS Overexertion from strenuous movement or load, sequela: Secondary | ICD-10-CM

## 2015-01-26 DIAGNOSIS — R29898 Other symptoms and signs involving the musculoskeletal system: Secondary | ICD-10-CM

## 2015-01-26 NOTE — Therapy (Signed)
[facility] [address] [place], [place], [address] Phone: [phone]   Fax:  [phone]  Physical Therapy Treatment  Patient Details  Name: Travis Aguilar MRN: [id number] Date of Birth: [birth year] Referring Provider:  Sheran Luz, MD  Encounter Date: 01/26/2015      PT End of Session - 01/26/15 1755    Visit Number 2   Number of Visits 16   Date for PT Re-Evaluation 02/03/15   Authorization Type BCBS   Authorization - Visit Number 2   Authorization - Number of Visits 16   PT Start Time 1730   PT Stop Time 1820   PT Time Calculation (min) 50 min   Activity Tolerance Patient tolerated treatment well   Behavior During Therapy [facility] for tasks assessed/performed      Past Medical History  Diagnosis Date  . Asthma     Past Surgical History  Procedure Laterality Date  . Tonsillectomy    . Surgery of lip      age 7 or 48    There were no vitals filed for this visit.  Visit Diagnosis:  Midline low back pain with sciatica, sciatica laterality unspecified  Hip stiffness, left  Weakness of left lower extremity  Weakness of trunk musculature  Overexertion from lifting with repetitive movement, sequela      Subjective Assessment - 01/26/15 1730    Subjective Pt stated pain scale 4/10 center lower back   Currently in Pain? Yes   Pain Score 4    Pain Location Back   Pain Orientation Lower   Pain Descriptors / Indicators Cramping;Tightness             [facility] Adult PT Treatment/Exercise - 01/26/15 0001    Exercises   Exercises Lumbar;Shoulder   Lumbar Exercises: Stretches   Active Hamstring Stretch 3 reps;20 seconds   Active Hamstring Stretch Limitations 3way on 14 in step   Piriformis Stretch 3 reps;20 seconds   Piriformis Stretch Limitations seated   Lumbar Exercises: Standing   Other Standing Lumbar Exercises 4 way pick up and reach 10x 3lb   Other Standing Lumbar Exercises Overhead dumbbell matrix   Lumbar Exercises: Seated   Other Seated Lumbar Exercises 3D thoracic spine excursiosn with UE overhead reaches 10x each   Shoulder Exercises: Stretch   Other Shoulder Stretches Pec strech 3x 20" 3 way   Other Shoulder Stretches Posterior shoulder stretch 3x 20" Bil             PT Education - 01/26/15 1819    Education provided Yes   Education Details Explained importance of proper body mechanics with lifting; another copy of HEP for piriformis and hamstring stretches and 3D thoracic spine excursion   Methods Explanation;Demonstration;Handout   Comprehension Verbalized understanding;Returned demonstration          PT Short Term Goals - 01/26/15 1820    PT SHORT TERM GOAL #1   Title Patient will be able to demosntrate icnreased thoracic spien rotation too Rt to >70 degrees indicatign improved ability to look over Lt shoulder while driving.    Status On-going   PT SHORT TERM GOAL #2   Title Patient willdmoenstrate a negative piriformis test and hamstring ROM of >80 degrees bilaterally with SLR.    Status On-going   PT SHORT TERM GOAL #3   Title Patint will dmoenstrate increased lumbar flexion fROM > 80 degrees to more easily bend down and touch toes.    Status On-going   PT  SHORT TERM GOAL #4   Title Patient will be indepenedent with HEP   Status On-going           PT Long Term Goals - 01/26/15 1821    PT LONG TERM GOAL #1   Title Patient will demonstrate icnreased glute max strength of 5//5 to be able to more easily lift heavy objects from te floor through his hips   Status On-going   PT LONG TERM GOAL #2   Title Patient will demonstrate icnreased hamstring strength of 5//5 to be able to more easily lift heavy objects from the floor through his knees to decrease strain on lumbar spine   Status On-going   PT LONG TERM GOAL #3   Title patient will dmeonstrate increased abdominal and lumbar paraspinal muscle strength of 5/5 MMT indicatign increased trunk stability for  lift items throughtou his lumbar spine in preventing lumbar spien strain   Status On-going   PT LONG TERM GOAL #4   Title Patiwent will be able to lift 100lb from the floor to chest height 10x in 2 minutes to be able to return to work at 100%.                Plan - 01/26/15 1755    Clinical Impression Statement Reviewed goals, assessed compliance with HEP and answered questions about exercises and copy of evaluation given.  Session focus on improving thoracic mobility, chest flexibilty, trunk stability and gluteal strengthening with education on proper body mechanics to reduce strain on back.  Pt able to demonstrate all exercises with min cueing for form and techniques.  Pt given another copy of HEP to assure correct form and technique complete at home.  Reports pain reduced at end of session.     PT Next Visit Plan Continue with stretches for pects, posterior shoulder and LE (hamstrings, piriformis, hip flexion), continue thoracic excursion and overhead dumbbell matrix to improve trunk stability.  Next session begin 3D hip excursion.          Problem List Patient Active Problem List   Diagnosis Date Noted  . Pars defect of lumbar spine 08/18/2014  . Asthma, mild intermittent 06/24/2013   Juel Burrow, PTA  Juel Burrow 01/26/2015, 6:23 PM  [facility] [address] [place], [place], [address] Phone: [phone]   Fax:  [phone]

## 2015-02-01 ENCOUNTER — Ambulatory Visit (HOSPITAL_COMMUNITY): Payer: BLUE CROSS/BLUE SHIELD

## 2015-02-01 DIAGNOSIS — X500XXS Overexertion from strenuous movement or load, sequela: Secondary | ICD-10-CM

## 2015-02-01 DIAGNOSIS — M25652 Stiffness of left hip, not elsewhere classified: Secondary | ICD-10-CM

## 2015-02-01 DIAGNOSIS — M6281 Muscle weakness (generalized): Secondary | ICD-10-CM

## 2015-02-01 DIAGNOSIS — M5440 Lumbago with sciatica, unspecified side: Secondary | ICD-10-CM

## 2015-02-01 DIAGNOSIS — M545 Low back pain: Secondary | ICD-10-CM | POA: Diagnosis not present

## 2015-02-01 DIAGNOSIS — R29898 Other symptoms and signs involving the musculoskeletal system: Secondary | ICD-10-CM

## 2015-02-01 NOTE — Therapy (Signed)
[facility] [address] [place], [place], [address] Phone: [phone]   Fax:  [phone]  Physical Therapy Treatment  Patient Details  Name: Travis Aguilar MRN: [id number] Date of Birth: 03/27/1991 Referring Provider:  Merlyn Albert, MD  Encounter Date: 02/01/2015      PT End of Session - 02/01/15 1653    Visit Number 3   Number of Visits 16   Date for PT Re-Evaluation 02/03/15   Authorization Type BCBS   Authorization - Visit Number 3   Authorization - Number of Visits 16   PT Start Time 1640   PT Stop Time 1728   PT Time Calculation (min) 48 min   Activity Tolerance Patient tolerated treatment well   Behavior During Therapy [facility] for tasks assessed/performed      Past Medical History  Diagnosis Date  . Asthma     Past Surgical History  Procedure Laterality Date  . Tonsillectomy    . Surgery of lip      age 24 or 44    There were no vitals filed for this visit.  Visit Diagnosis:  Midline low back pain with sciatica, sciatica laterality unspecified  Hip stiffness, left  Weakness of left lower extremity  Weakness of trunk musculature  Overexertion from lifting with repetitive movement, sequela      Subjective Assessment - 02/01/15 1640    Subjective Pt stated his back felt good but legs were sore following last session.  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 Yes   Pain Score 6    Pain Location Back   Pain Orientation Lower   Pain Descriptors / Indicators Stabbing            [facility] PT Assessment - 02/01/15 0001    Assessment   Medical Diagnosis back pain,    Onset Date/Surgical Date 11/04/14   Next MD Visit Ardyth Gal unscheduled   Prior Therapy No          [facility] Adult PT Treatment/Exercise - 02/01/15 0001    Exercises   Exercises Lumbar;Knee/Hip;Shoulder   Lumbar Exercises: Stretches   Active Hamstring Stretch 3  reps;30 seconds   Active Hamstring Stretch Limitations 14 instep   Hip Flexor Stretch 3 reps;30 seconds   Hip Flexor Stretch Limitations 14 ins tep   Piriformis Stretch 3 reps;30 seconds   Piriformis Stretch Limitations seated   Lumbar Exercises: Standing   Scapular Retraction Both;10 reps;Theraband   Theraband Level (Scapular Retraction) Level 3 (Green)   Row Both;10 reps;Theraband   Theraband Level (Row) Level 3 (Green)   Shoulder Extension Both;10 reps;Theraband   Theraband Level (Shoulder Extension) Level 3 (Green)   Other Standing Lumbar Exercises 4 way pick up and reach 10x 3lb   Other Standing Lumbar Exercises Overhead dumbbell matrix 3# 10x   Knee/Hip Exercises: Standing   Other Standing Knee Exercises 3D hip excursion 10x   Shoulder Exercises: Seated   Other Seated Exercises posterior shoulder rolls (up, back, and down)   Other Seated Exercises 3D thoracic excursion            PT Short Term Goals - 02/01/15 1654    PT SHORT TERM GOAL #1   Title Patient will be able to demosntrate icnreased thoracic spien rotation too Rt to >70 degrees indicatign improved ability to look over Lt shoulder while driving.    Status On-going  PT SHORT TERM GOAL #2   Title Patient will dmoenstrate a negative piriformis test and hamstring ROM of >80 degrees bilaterally with SLR.    Status On-going   PT SHORT TERM GOAL #3   Title Patint will dmoenstrate increased lumbar flexion fROM > 80 degrees to more easily bend down and touch toes.    Status On-going   PT SHORT TERM GOAL #4   Title Patient will be indepenedent with HEP   Status On-going           PT Long Term Goals - 02/01/15 1655    PT LONG TERM GOAL #1   Title Patient will demonstrate icnreased glute max strength of 5//5 to be able to more easily lift heavy objects from te floor through his hips   Status On-going   PT LONG TERM GOAL #2   Title Patient will demonstrate icnreased hamstring strength of 5//5 to be able to more  easily lift heavy objects from the floor through his knees to decrease strain on lumbar spine   Status On-going   PT LONG TERM GOAL #3   Title patient will dmeonstrate increased abdominal and lumbar paraspinal muscle strength of 5/5 MMT indicatign increased trunk stability for lift items throughtou his lumbar spine in preventing lumbar spien strain   Status On-going   PT LONG TERM GOAL #4   Title Patiwent will be able to lift 100lb from the floor to chest height 10x in 2 minutes to be able to return to work at 100%.                Plan - 02/01/15 1711    Clinical Impression Statement Session focus on improving thoracic and hip mobility with excursion exercises combined with stretches for pain control.  Pt required cueing through session to reduce forward rolled shoulder, pt educated on importance of proper posutre, continued with thoraic excursion and began posterior shoulder rolls and posture strengthening activities with min cueing for form.  Pt stated pain free at end of session.   PT Next Visit Plan Continue with pect and anterior shoulder stretches, LE stretches (hamstirngs, piriformis and hip flexor) continue thoracic and hip excursions, overhead dumbbell matrix to improve trunk stability and posture strengthening.  Progress core and proper lifting techniques.  Begin lunges next session.        Problem List Patient Active Problem List   Diagnosis Date Noted  . Pars defect of lumbar spine 08/18/2014  . Asthma, mild intermittent 06/24/2013   Juel Burrow, PTA  Juel Burrow 02/01/2015, 5:30 PM  [facility] [address] [place], [place], [address] Phone: [phone]   Fax:  [phone]

## 2015-02-02 ENCOUNTER — Ambulatory Visit (HOSPITAL_COMMUNITY): Payer: BLUE CROSS/BLUE SHIELD

## 2015-02-02 DIAGNOSIS — M5440 Lumbago with sciatica, unspecified side: Secondary | ICD-10-CM

## 2015-02-02 DIAGNOSIS — M545 Low back pain: Secondary | ICD-10-CM | POA: Diagnosis not present

## 2015-02-02 DIAGNOSIS — M25652 Stiffness of left hip, not elsewhere classified: Secondary | ICD-10-CM

## 2015-02-02 DIAGNOSIS — R29898 Other symptoms and signs involving the musculoskeletal system: Secondary | ICD-10-CM

## 2015-02-02 DIAGNOSIS — X500XXS Overexertion from strenuous movement or load, sequela: Secondary | ICD-10-CM

## 2015-02-02 DIAGNOSIS — M6281 Muscle weakness (generalized): Secondary | ICD-10-CM

## 2015-02-02 NOTE — Therapy (Signed)
[facility] [address] [place], [place], [address] Phone: [phone]   Fax:  [phone]  Physical Therapy Treatment  Patient Details  Name: Travis Aguilar MRN: [id number] Date of Birth: Aug 09, 1991 Referring Provider:  Merlyn Albert, MD  Encounter Date: 02/02/2015      PT End of Session - 02/02/15 1654    Visit Number 4   Number of Visits 16   Date for PT Re-Evaluation 02/03/15   Authorization Type BCBS   Authorization - Visit Number 4   Authorization - Number of Visits 16   PT Start Time 1651   PT Stop Time 1733   PT Time Calculation (min) 42 min   Activity Tolerance Patient tolerated treatment well   Behavior During Therapy [facility] for tasks assessed/performed      Past Medical History  Diagnosis Date  . Asthma     Past Surgical History  Procedure Laterality Date  . Tonsillectomy    . Surgery of lip      age 24 or 24    There were no vitals filed for this visit.  Visit Diagnosis:  Midline low back pain with sciatica, sciatica laterality unspecified  Hip stiffness, left  Weakness of left lower extremity  Weakness of trunk musculature  Overexertion from lifting with repetitive movement, sequela      Subjective Assessment - 02/02/15 1653    Subjective Pt stated he is pain free today, has had a busy day today.   Currently in Pain? No/denies                         [facility] Adult PT Treatment/Exercise - 02/02/15 0001    Exercises   Exercises Lumbar;Knee/Hip;Shoulder   Lumbar Exercises: Stretches   Active Hamstring Stretch 3 reps;30 seconds   Active Hamstring Stretch Limitations 14 instep   Hip Flexor Stretch 3 reps;30 seconds   Hip Flexor Stretch Limitations 14 in step   Piriformis Stretch 3 reps;30 seconds   Piriformis Stretch Limitations seated   Lumbar Exercises: Standing   Forward Lunge 10 reps   Forward Lunge Limitations 6in step   Side Lunge 10 reps   Side Lunge Limitations 6in  step   Scapular Retraction Both;15 reps;Theraband   Theraband Level (Scapular Retraction) Level 3 (Green)   Row Both;15 reps;Theraband   Theraband Level (Row) Level 3 (Green)   Shoulder Extension Both;15 reps;Theraband   Theraband Level (Shoulder Extension) Level 3 (Green)   Other Standing Lumbar Exercises 4 way pick up and reach 10x 4lb   Other Standing Lumbar Exercises Overhead dumbbell matrix 4# 10x   Lumbar Exercises: Seated   Other Seated Lumbar Exercises 3D thoracic spine excursiosn with UE overhead reaches 10x each   Knee/Hip Exercises: Standing   Other Standing Knee Exercises 3D hip excursion 10x   Shoulder Exercises: Seated   Other Seated Exercises posterior shoulder rolls (up, back, and down)                PT Education - 02/01/15 1730    Education provided Yes   Education Details Educated on importance of proper posture   Person(s) Educated Patient   Methods Explanation;Demonstration   Comprehension Verbalized understanding;Returned demonstration          PT Short Term Goals - 02/02/15 1655    PT SHORT TERM GOAL #1   Title Patient will be able to demosntrate icnreased thoracic spien rotation too Rt to >70 degrees indicatign improved ability  to look over Lt shoulder while driving.    Status On-going   PT SHORT TERM GOAL #2   Title Patient will dmoenstrate a negative piriformis test and hamstring ROM of >80 degrees bilaterally with SLR.    Status On-going   PT SHORT TERM GOAL #3   Title Patint will dmoenstrate increased lumbar flexion fROM > 80 degrees to more easily bend down and touch toes.    Status On-going   PT SHORT TERM GOAL #4   Title Patient will be indepenedent with HEP   Status On-going           PT Long Term Goals - 02/02/15 1657    PT LONG TERM GOAL #1   Title Patient will demonstrate icnreased glute max strength of 5//5 to be able to more easily lift heavy objects from te floor through his hips   Status On-going   PT LONG TERM GOAL #2    Title Patient will demonstrate icnreased hamstring strength of 5//5 to be able to more easily lift heavy objects from the floor through his knees to decrease strain on lumbar spine   Status On-going   PT LONG TERM GOAL #3   Title patient will dmeonstrate increased abdominal and lumbar paraspinal muscle strength of 5/5 MMT indicatign increased trunk stability for lift items throughtou his lumbar spine in preventing lumbar spien strain   Status On-going   PT LONG TERM GOAL #4   Title Patiwent will be able to lift 100lb from the floor to chest height 10x in 2 minutes to be able to return to work at 100%.                Plan - 02/02/15 1731    Clinical Impression Statement Session focus initially on improving thoracic and hip mobility, posture education/strengthening and gluteal strengthening.  Pt presented with improved awareness of posture through session with minimal cueing tio reduce forward rolled shoulder.  Began forward and lateral lunges for gluteal strengthening wtih min cueing for form and techniqje.  Pt limited by fatigue through session, no reports of pain through session.     PT Next Visit Plan Continue with pect and anterior shoulder stretches, LE stretches (hamstirngs, piriformis and hip flexor) continue thoracic and hip excursions, overhead dumbbell matrix to improve trunk stability and posture strengthening.  Progress core and proper lifting techniques.          Problem List Patient Active Problem List   Diagnosis Date Noted  . Pars defect of lumbar spine 08/18/2014  . Asthma, mild intermittent 06/24/2013   Juel Burrow, PTA  Juel Burrow 02/02/2015, 5:37 PM  [facility] [address] [place], [place], [address] Phone: [phone]   Fax:  [phone]

## 2015-02-08 ENCOUNTER — Ambulatory Visit (HOSPITAL_COMMUNITY): Payer: BLUE CROSS/BLUE SHIELD

## 2015-02-08 DIAGNOSIS — M25652 Stiffness of left hip, not elsewhere classified: Secondary | ICD-10-CM

## 2015-02-08 DIAGNOSIS — M6281 Muscle weakness (generalized): Secondary | ICD-10-CM

## 2015-02-08 DIAGNOSIS — X503XXS Overexertion from repetitive movements, sequela: Secondary | ICD-10-CM

## 2015-02-08 DIAGNOSIS — R29898 Other symptoms and signs involving the musculoskeletal system: Secondary | ICD-10-CM

## 2015-02-08 DIAGNOSIS — X500XXS Overexertion from strenuous movement or load, sequela: Secondary | ICD-10-CM

## 2015-02-08 DIAGNOSIS — M5440 Lumbago with sciatica, unspecified side: Secondary | ICD-10-CM

## 2015-02-08 DIAGNOSIS — M545 Low back pain: Secondary | ICD-10-CM | POA: Diagnosis not present

## 2015-02-08 NOTE — Patient Instructions (Signed)
Forward Lunge   Standing with feet shoulder width apart and stomach tight, step forward with left leg. Repeat 10-20  times per set. Do 1-2 sets per session. http://orth.exer.us/1146   Copyright  VHI. All rights reserved.   Lateral Hip Dominant Lunge   Hands clasped behind head, lunge to side, keeping erect posture. Bend knee as much as 90. Return by stepping to moving leg with other. Lunge 10-20 times with same leg, then with other. Do 1-2 sessions per day. Copyright  VHI. All rights reserved.

## 2015-02-09 NOTE — Therapy (Signed)
[facility] [address] [place], [place], [address] Phone: [phone]   Fax:  [phone]  Physical Therapy Treatment  Patient Details  Name: Travis Aguilar MRN: [id number] Date of Birth: 12-08-1990 Referring Provider:  Sheran Luz, MD  Encounter Date: 02/08/2015      PT End of Session - 02/08/15 1614    Visit Number 5   Number of Visits 16   Authorization Type BCBS   Authorization - Visit Number 5   Authorization - Number of Visits 16   PT Start Time 1605   PT Stop Time 1658   PT Time Calculation (min) 53 min   Activity Tolerance Patient tolerated treatment well   Behavior During Therapy [facility] for tasks assessed/performed      Past Medical History  Diagnosis Date  . Asthma     Past Surgical History  Procedure Laterality Date  . Tonsillectomy    . Surgery of lip      age 24 or 1    There were no vitals filed for this visit.  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  Reports he has been more aware of posture throughout day   Currently in Pain? No/denies        02/08/15 0001  Assessment  Medical Diagnosis back pain,   Onset Date/Surgical Date 11/04/14  Next MD Visit Ardyth Gal unscheduled  Prior Therapy No  Observation/Other Assessments  Focus on Therapeutic Outcomes (FOTO)  29% limitation (was 27% limited)  ROM / Strength  AROM / PROM / Strength AROM;Strength  AROM  AROM Assessment Site Knee;Hip;Lumbar;Thoracic  Right Hip Flexion 120  Right Hip External Rotation  45  Right Hip Internal Rotation  38 (was 35)  Left Hip Flexion 100 (was 90)  Left Hip External  Rotation  45  Left Hip Internal Rotation  35 (was 25)  Right/Left Knee Right;Left  Right/Left Ankle Right  Right Ankle Dorsiflexion 20 (was 5)  Left Ankle Dorsiflexion 20 (was 5)  Lumbar Flexion 110 (was 62)  Lumbar Extension WNL (was 42)  Thoracic - Right Rotation 85 (was 85)  Thoracic - Left Rotation 70 (was 55)  Strength  Strength Assessment Site Lumbar;Hip;Knee;Ankle  Right/Left Hip Right;Left  Right Hip Extension 4+/5 (was 4-/5)  Right Hip ABduction 4+/5 (was 4-/5)  Left Hip Extension 4+/5 (was 4-/5)  Left Hip ABduction 4+/5 (was 4-/5)  Right/Left Knee Right;Left  Right Knee Flexion 5/5 (was 4/5)  Right Knee Extension 5/5 (was 4+/5)  Left Knee Flexion 5/5 (was 4/5)  Left Knee Extension 5/5 (was 4+/5)  Right/Left Ankle Right  Lumbar Flexion 3+/5 (was 2+/5)  Lumbar Extension 4-/5 (was 3-/5)  Right Hip Flexion 4+/5  Left Hip Flexion 4+/5  Flexibility  Hamstrings Lt 88 degrees; Rt 90 degrees (Rt 80 degrees, Lt 60 degrees)  Quadriceps equal (limited more on Lt than Rt)  Piriformis equal (limited more on Lt than Rt. )        02/08/15 0001  Exercises  Exercises Lumbar;Knee/Hip  Lumbar Exercises: Stretches  Active Hamstring Stretch 3 reps;30 seconds  Piriformis Stretch 3 reps;30 seconds  Piriformis Stretch Limitations seated  Lumbar Exercises: Standing  Lifting From floor;10 reps  Lifting Weights (lbs) 23 then 35  Forward Lunge 10 reps  Forward Lunge Limitations 4in step and on floor  Side Lunge 10 reps  Side Lunge Limitations 4in step and on floor  Other Standing Lumbar Exercises 4 way pick up and reach 10x 4lb  Lumbar Exercises: Seated  Other Seated Lumbar Exercises 3D thoracic spine excursiosn with UE overhead reaches 10x each  Knee/Hip Exercises: Standing  Functional Squat 20 reps  Other Standing Knee Exercises 3D hip excursion 15x           PT Short Term Goals - 02/08/15 1615    PT SHORT TERM GOAL #1   Title Patient will be able to  demosntrate icnreased thoracic spien rotation too Rt to >70 degrees indicatign improved ability to look over Lt shoulder while driving.    Status Achieved   PT SHORT TERM GOAL #2   Title Patient will dmoenstrate a negative piriformis test and hamstring ROM of >80 degrees bilaterally with SLR.    Status Achieved   PT SHORT TERM GOAL #3   Title Patint will dmoenstrate increased lumbar flexion fROM > 80 degrees to more easily bend down and touch toes.    Status Achieved   PT SHORT TERM GOAL #4   Title Patient will be indepenedent with HEP   Baseline 02/08/2015 compliance with HEP 2-4x a week   Status On-going           PT Long Term Goals - 02/08/15 1652    PT LONG TERM GOAL #1   Title Patient will demonstrate icnreased glute max strength of 5//5 to be able to more easily lift heavy objects from te floor through his hips   Status On-going   PT LONG TERM GOAL #2   Title Patient will demonstrate icnreased hamstring strength of 5//5 to be able to more easily lift heavy objects from the floor through his knees to decrease strain on lumbar spine   Status Achieved   PT LONG TERM GOAL #3   Title patient will dmeonstrate increased abdominal and lumbar paraspinal muscle strength of 5/5 MMT indicatign increased trunk stability for lift items throughtou his lumbar spine in preventing lumbar spien strain   Status On-going   PT LONG TERM GOAL #4   Title Patiwent will be able to lift 100lb from the floor to chest height 10x in 2 minutes to be able to return to work at 100%.    Status On-going               Plan - 02/08/15 1850    Clinical Impression Statement Reassessment complete wtih the following findings: Pt reports compliance with HEP 2-4x a week.  Improved hip mobiltiy and overall strength has improved for all LE musculature.  Pt continues to required cueing for proper mechanics with proper lifting with reports of pain following.  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     PT Next Visit Plan Recommend continuing OPPT for 4 more weeks to address proper lifting techniques, core and posture strengthening to reduce pain and risk of injury with improper body mechanics.     PT Home Exercise Plan lunges, 3D hip excursion        Problem List Patient Active Problem List   Diagnosis Date Noted  . Pars defect of lumbar spine 08/18/2014  . Asthma, mild intermittent 06/24/2013   Juel Burrowasey Jo Elhadj Girton, PTA  Becky Saxockerham, Yamel Bale  Alvino Chapel 02/09/2015, 9:44 AM   Physical Therapy Progress Note  Dates of Reporting Period: 01/04/15 to 02/08/15  Objective Reports of Subjective Statement: Patient states he is more aware of posture and is only experiencing back pain when standing with large tires recently.   Objective Measurements: see above   Goal Update: see above   Plan: see above   Reason Skilled Services are Required: To address functional lifting techniques, also to improve posture and overall core strength/stability in order to reduce pain and reduce risk of injury recurrence.   Nedra Hai PT, DPT [phone]     [facility] [address] [place], [place], [address] Phone: [phone]   Fax:  [phone]

## 2015-02-10 ENCOUNTER — Ambulatory Visit (HOSPITAL_COMMUNITY): Payer: BLUE CROSS/BLUE SHIELD | Attending: Physical Medicine and Rehabilitation

## 2015-02-10 DIAGNOSIS — M5440 Lumbago with sciatica, unspecified side: Secondary | ICD-10-CM | POA: Insufficient documentation

## 2015-02-10 DIAGNOSIS — X503XXS Overexertion from repetitive movements, sequela: Secondary | ICD-10-CM

## 2015-02-10 DIAGNOSIS — R29898 Other symptoms and signs involving the musculoskeletal system: Secondary | ICD-10-CM | POA: Insufficient documentation

## 2015-02-10 DIAGNOSIS — T733XXS Exhaustion due to excessive exertion, sequela: Secondary | ICD-10-CM | POA: Insufficient documentation

## 2015-02-10 DIAGNOSIS — X58XXXS Exposure to other specified factors, sequela: Secondary | ICD-10-CM | POA: Insufficient documentation

## 2015-02-10 DIAGNOSIS — M6281 Muscle weakness (generalized): Secondary | ICD-10-CM | POA: Insufficient documentation

## 2015-02-10 DIAGNOSIS — X500XXS Overexertion from strenuous movement or load, sequela: Secondary | ICD-10-CM

## 2015-02-10 DIAGNOSIS — M25652 Stiffness of left hip, not elsewhere classified: Secondary | ICD-10-CM | POA: Insufficient documentation

## 2015-02-10 NOTE — Therapy (Signed)
[facility] [address] [place], [place], [address] Phone: [phone]   Fax:  [phone]  Physical Therapy Treatment  Patient Details  Name: Travis CarwinSteven Aguilar MRN: [id number] Date of Birth: 02-19-1991 Referring Provider:  Sheran Luzamos, Richard, MD  Encounter Date: 02/10/2015      PT End of Session - 02/10/15 1705    Visit Number 6   Number of Visits 16   Date for PT Re-Evaluation 03/08/15   Authorization Type BCBS   Authorization - Visit Number 6   Authorization - Number of Visits 16   PT Start Time 1653   PT Stop Time 1735   PT Time Calculation (min) 42 min   Activity Tolerance Patient tolerated treatment well   Behavior During Therapy [facility] for tasks assessed/performed      Past Medical History  Diagnosis Date  . Asthma     Past Surgical History  Procedure Laterality Date  . Tonsillectomy    . Surgery of lip      age [age] or 89    There were no vitals filed for this visit.  Visit Diagnosis:  Midline low back pain with sciatica, sciatica laterality unspecified  Weakness of left lower extremity  Hip stiffness, left  Overexertion from lifting with repetitive movement, sequela  Weakness of trunk musculature      Subjective Assessment - 02/10/15 1658    Subjective Pt stated pain free today, just tired from busy day at work.  Minimal pain with kneeling and lifting large tires.   Currently in Pain? No/denies           [facility] Adult PT Treatment/Exercise - 02/10/15 0001    Exercises   Exercises Lumbar;Knee/Hip   Lumbar Exercises: Standing   Lifting From floor;From waist;5 reps   Lifting Weights (lbs) 23   Lifting Limitations proper lifting squat transition to kneeling Both sides to simulate putting on tire   Scapular Retraction Both;15 reps;Theraband   Theraband Level (Scapular Retraction) Level 4 (Blue)   Row Both;15 reps;Theraband   Theraband Level (Row) Level 4 (Blue)   Shoulder Extension Both;15 reps;Theraband   Theraband Level (Shoulder Extension) Level 4 (Blue)   Lumbar Exercises: Quadruped   Single Arm Raise Right;Left;10 reps   Straight Leg Raise 10 reps   Opposite Arm/Leg Raise Right arm/Left leg;Left arm/Right leg;10 reps;5 seconds   Plank 4x 10"   Other Quadruped Lumbar Exercises plank at edge of table with LE ground matrix 10x each             PT Short Term Goals - 02/10/15 1743    PT SHORT TERM GOAL #1   Title Patient will be able to demosntrate icnreased thoracic spien rotation too Rt to >70 degrees indicatign improved ability to look over Lt shoulder while driving.    Status Achieved   PT SHORT TERM GOAL #2   Title Patient will dmoenstrate a negative piriformis test and hamstring ROM of >80 degrees bilaterally with SLR.    Status Achieved   PT SHORT TERM GOAL #3   Title Patint will dmoenstrate increased lumbar flexion fROM > 80 degrees to more easily bend down and touch toes.    Status Achieved   PT SHORT TERM GOAL #4   Title Patient will be indepenedent with HEP   Status On-going           PT Long Term Goals - 02/10/15 1744    PT LONG TERM GOAL #1   Title Patient will demonstrate icnreased  glute max strength of 5//5 to be able to more easily lift heavy objects from te floor through his hips   Status On-going   PT LONG TERM GOAL #2   Status Achieved   PT LONG TERM GOAL #3   Title patient will dmeonstrate increased abdominal and lumbar paraspinal muscle strength of 5/5 MMT indicatign increased trunk stability for lift items throughtou his lumbar spine in preventing lumbar spien strain   Status On-going   PT LONG TERM GOAL #4   Title Patiwent will be able to lift 100lb from the floor to chest height 10x in 2 minutes to be able to return to work at 100%.    Status On-going               Plan - 02/10/15 1738    Clinical Impression Statement Session focus on improving core and lumbar strengthening and body mechanics.  Began quadruped and plank exercises with  minimal cueing for stability utilizing dowel rod across lumbar area to improve awareness for stabilty. Began functional work stimulation with focus on body mechanics with min cueing required for form, including lifting rotation with reports of minimal increased pain with new activity     PT Next Visit Plan Continue with current PT POC with focus on core, posture strengtheing and body mechanics.        Problem List Patient Active Problem List   Diagnosis Date Noted  . Pars defect of lumbar spine 08/18/2014  . Asthma, mild intermittent 06/24/2013   Juel Burrow, PTA  Juel Burrow 02/10/2015, 5:47 PM  [facility] [address] [place], [place], [address] Phone: [phone]   Fax:  [phone]

## 2015-02-14 ENCOUNTER — Ambulatory Visit (HOSPITAL_COMMUNITY): Payer: BLUE CROSS/BLUE SHIELD

## 2015-02-14 DIAGNOSIS — X500XXS Overexertion from strenuous movement or load, sequela: Secondary | ICD-10-CM

## 2015-02-14 DIAGNOSIS — M5440 Lumbago with sciatica, unspecified side: Secondary | ICD-10-CM

## 2015-02-14 DIAGNOSIS — R29898 Other symptoms and signs involving the musculoskeletal system: Secondary | ICD-10-CM

## 2015-02-14 DIAGNOSIS — M25652 Stiffness of left hip, not elsewhere classified: Secondary | ICD-10-CM

## 2015-02-14 DIAGNOSIS — M6281 Muscle weakness (generalized): Secondary | ICD-10-CM

## 2015-02-14 DIAGNOSIS — X503XXS Overexertion from repetitive movements, sequela: Secondary | ICD-10-CM

## 2015-02-14 NOTE — Therapy (Signed)
[facility] [address] [place], [place], [address] Phone: [phone]   Fax:  [phone]  Physical Therapy Treatment  Patient Details  Name: Travis CarwinSteven Aguilar MRN: [id number] Date of Birth: [birth year] Referring Provider:  Merlyn AlbertLuking, William S, MD  Encounter Date: 02/14/2015      PT End of Session - 02/14/15 1851    Visit Number 7   Number of Visits 16   Date for PT Re-Evaluation 03/08/15   Authorization Type BCBS   Authorization - Visit Number 7   Authorization - Number of Visits 16   PT Start Time 1734   PT Stop Time 1820   PT Time Calculation (min) 46 min   Activity Tolerance Patient tolerated treatment well   Behavior During Therapy [facility] for tasks assessed/performed      Past Medical History  Diagnosis Date  . Asthma     Past Surgical History  Procedure Laterality Date  . Tonsillectomy    . Surgery of lip      age 24 or [age]    There were no vitals filed for this visit.  Visit Diagnosis:  Midline low back pain with sciatica, sciatica laterality unspecified  Weakness of left lower extremity  Hip stiffness, left  Overexertion from lifting with repetitive movement, sequela  Weakness of trunk musculature      Subjective Assessment - 02/14/15 1742    Subjective Pt stated pain scale 4/10 lower back with lifting   Currently in Pain? Yes   Pain Score 4    Pain Location Back   Pain Orientation Lower             [facility] Adult PT Treatment/Exercise - 02/14/15 0001    Exercises   Exercises Lumbar;Knee/Hip   Lumbar Exercises: Stretches   Piriformis Stretch 3 reps;30 seconds   Piriformis Stretch Limitations seated   Lumbar Exercises: Standing   Scapular Retraction Both;20 reps;Theraband   Theraband Level (Scapular Retraction) Level 4 (Blue)   Scapular Retraction Limitations HEP   Row Both;20 reps;Theraband   Theraband Level (Row) Level 4 (Blue)   Row Limitations HEP   Shoulder Extension Both;20 reps;Theraband   Theraband Level (Shoulder Extension) Level 4 (Blue)   Shoulder Extension Limitations HEP   Other Standing Lumbar Exercises 4 way pick up and reach 10x 8lb   Other Standing Lumbar Exercises Sled push/pull with 50# 2RT down hallway   Lumbar Exercises: Seated   Other Seated Lumbar Exercises 3D thoracic spine excursiosn with UE overhead reaches 10x each   Lumbar Exercises: Quadruped   Single Arm Raise Right;Left;10 reps   Straight Leg Raise 10 reps   Opposite Arm/Leg Raise Right arm/Left leg;Left arm/Right leg;10 reps;5 seconds   Plank 3x 15"            PT Short Term Goals - 02/10/15 1743    PT SHORT TERM GOAL #1   Title Patient will be able to demosntrate icnreased thoracic spien rotation too Rt to >70 degrees indicatign improved ability to look over Lt shoulder while driving.    Status Achieved   PT SHORT TERM GOAL #2   Title Patient will dmoenstrate a negative piriformis test and hamstring ROM of >80 degrees bilaterally with SLR.    Status Achieved   PT SHORT TERM GOAL #3   Title Patint will dmoenstrate increased lumbar flexion fROM > 80 degrees to more easily bend down and touch toes.    Status Achieved   PT SHORT TERM GOAL #4   Title  Patient will be indepenedent with HEP   Status On-going           PT Long Term Goals - 02/10/15 1744    PT LONG TERM GOAL #1   Title Patient will demonstrate icnreased glute max strength of 5//5 to be able to more easily lift heavy objects from te floor through his hips   Status On-going   PT LONG TERM GOAL #2   Status Achieved   PT LONG TERM GOAL #3   Title patient will dmeonstrate increased abdominal and lumbar paraspinal muscle strength of 5/5 MMT indicatign increased trunk stability for lift items throughtou his lumbar spine in preventing lumbar spien strain   Status On-going   PT LONG TERM GOAL #4   Title Patiwent will be able to lift 100lb from the floor to chest height 10x in 2 minutes to be able to return to work at 100%.     Status On-going               Plan - 02/14/15 1851    Clinical Impression Statement Continued session focus on improve core/lumbar strengthening and body mechanics.  Increased hold time with planiks with noted instabilty though no reports of increased pain.  Began sled push and pull to educate pt on use of core musculature with lifting and work activities.  Reduced load with proper lifting this session following reports of increased pain with the functional work activity completed last session.  Pt improving awareness of proper posture and able to demosntrate appropriate techniques wtih theraband exercises, pt given print out and therband to add to HEP.  Pt stated pain scale reduced to 2/10 at end of session.     PT Next Visit Plan Continue with current PT POC with focus on core, posture strengtheing and body mechanics.        Problem List Patient Active Problem List   Diagnosis Date Noted  . Pars defect of lumbar spine 08/18/2014  . Asthma, mild intermittent 06/24/2013   Juel Burrow, PTA  Juel Burrow 02/14/2015, 6:57 PM  [facility] [address] [place], [place], [address] Phone: [phone]   Fax:  [phone]

## 2015-02-16 ENCOUNTER — Ambulatory Visit (HOSPITAL_COMMUNITY): Payer: BLUE CROSS/BLUE SHIELD

## 2015-02-16 DIAGNOSIS — R29898 Other symptoms and signs involving the musculoskeletal system: Secondary | ICD-10-CM

## 2015-02-16 DIAGNOSIS — M25652 Stiffness of left hip, not elsewhere classified: Secondary | ICD-10-CM

## 2015-02-16 DIAGNOSIS — M5440 Lumbago with sciatica, unspecified side: Secondary | ICD-10-CM

## 2015-02-16 DIAGNOSIS — M6281 Muscle weakness (generalized): Secondary | ICD-10-CM

## 2015-02-16 DIAGNOSIS — X500XXS Overexertion from strenuous movement or load, sequela: Secondary | ICD-10-CM

## 2015-02-16 NOTE — Therapy (Signed)
[facility] [address] [place], [place], [address] Phone: [phone]   Fax:  [phone]  Physical Therapy Treatment  Patient Details  Name: Travis Aguilar MRN: [id number] Date of Birth: September 07, 1990 Referring Provider:  Merlyn Albert, MD  Encounter Date: 02/16/2015      PT End of Session - 02/16/15 1814    Visit Number 8   Number of Visits 16   Date for PT Re-Evaluation 03/08/15   Authorization Type BCBS   Authorization - Visit Number 8   Authorization - Number of Visits 16   PT Start Time 1734   PT Stop Time 1823   PT Time Calculation (min) 49 min   Activity Tolerance Patient tolerated treatment well   Behavior During Therapy [facility] for tasks assessed/performed      Past Medical History  Diagnosis Date  . Asthma     Past Surgical History  Procedure Laterality Date  . Tonsillectomy    . Surgery of lip      age 5 or 81    There were no vitals filed for this visit.  Visit Diagnosis:  Midline low back pain with sciatica, sciatica laterality unspecified  Weakness of left lower extremity  Hip stiffness, left  Overexertion from lifting with repetitive movement, sequela  Weakness of trunk musculature      Subjective Assessment - 02/16/15 1755    Subjective Pt stated pain free at entrance, continues to have back pain with lifting.   Currently in Pain? No/denies             [facility] Adult PT Treatment/Exercise - 02/16/15 0001    Exercises   Exercises Lumbar;Knee/Hip   Lumbar Exercises: Stretches   Pelvic Tilt --  10x seated and supine   Piriformis Stretch 3 reps;30 seconds   Piriformis Stretch Limitations seated   Lumbar Exercises: Standing   Lifting From 12";10 reps;Weights   Lifting Weights (lbs) orange ball   Other Standing Lumbar Exercises 3D hip excursion 10x   Lumbar Exercises: Supine   Ab Set 15 reps;5 seconds   AB Set Limitations PFC with therapist facilitation   Dead Bug 10 reps;5 seconds   Dead Bug Limitations with ab set   Bridge Limitations 3 sets x 10x   Straight Leg Raise 10 reps   Straight Leg Raises Limitations floating   Lumbar Exercises: Quadruped   Other Quadruped Lumbar Exercises child's pose   Manual Therapy   Manual Therapy Muscle Energy Technique   Manual therapy comments Lt SI anterior rotation and outflare f/b core strenghtening exercises                  PT Short Term Goals - 02/16/15 1813    PT SHORT TERM GOAL #1   Title Patient will be able to demosntrate icnreased thoracic spien rotation too Rt to >70 degrees indicatign improved ability to look over Lt shoulder while driving.    Status Achieved   PT SHORT TERM GOAL #2   Title Patient will dmoenstrate a negative piriformis test and hamstring ROM of >80 degrees bilaterally with SLR.    Status Achieved   PT SHORT TERM GOAL #3   Title Patint will dmoenstrate increased lumbar flexion fROM > 80 degrees to more easily bend down and touch toes.    Status Achieved   PT SHORT TERM GOAL #4   Title Patient will be indepenedent with HEP   Status On-going  PT Long Term Goals - 02/16/15 1813    PT LONG TERM GOAL #1   Title Patient will demonstrate icnreased glute max strength of 5//5 to be able to more easily lift heavy objects from te floor through his hips   Status On-going   PT LONG TERM GOAL #2   Title Patient will demonstrate icnreased hamstring strength of 5//5 to be able to more easily lift heavy objects from the floor through his knees to decrease strain on lumbar spine   Status Achieved   PT LONG TERM GOAL #3   Title patient will dmeonstrate increased abdominal and lumbar paraspinal muscle strength of 5/5 MMT indicatign increased trunk stability for lift items throughtou his lumbar spine in preventing lumbar spien strain   Status On-going   PT LONG TERM GOAL #4   Title Patiwent will be able to lift 100lb from the floor to chest height 10x in 2 minutes to be able to return to  work at 100%.    Status On-going               Plan - 02/16/15 1806    Clinical Impression Statement Pt reported increased back pain following lifting exercises with hand placement directionly over Lt SI indicating pain spot, pain scale 3/10.  After checking SI alignment found Lt SI anteriorly rotated and outflare.  Muscle energy techniques complete to improve SI alignment followed by session focus on core exercises.  Pt required multimodal cueing to improve appropriate abdominal contraction.   PT Next Visit Plan Continue with current PT POC with focus on core, posture strengtheing and body mechanics.        Problem List Patient Active Problem List   Diagnosis Date Noted  . Pars defect of lumbar spine 08/18/2014  . Asthma, mild intermittent 06/24/2013   Becky Saxasey Efe Fazzino, LPTA; CBIS [phone]  Juel BurrowCockerham, Garvin Ellena Jo 02/16/2015, 6:16 PM  [facility] [address] [place], [place], [address] Phone: [phone]   Fax:  [phone]

## 2015-02-21 ENCOUNTER — Ambulatory Visit (HOSPITAL_COMMUNITY): Payer: BLUE CROSS/BLUE SHIELD

## 2015-02-21 DIAGNOSIS — X500XXS Overexertion from strenuous movement or load, sequela: Secondary | ICD-10-CM

## 2015-02-21 DIAGNOSIS — R29898 Other symptoms and signs involving the musculoskeletal system: Secondary | ICD-10-CM

## 2015-02-21 DIAGNOSIS — M5440 Lumbago with sciatica, unspecified side: Secondary | ICD-10-CM

## 2015-02-21 DIAGNOSIS — M6281 Muscle weakness (generalized): Secondary | ICD-10-CM

## 2015-02-21 DIAGNOSIS — M25652 Stiffness of left hip, not elsewhere classified: Secondary | ICD-10-CM

## 2015-02-21 NOTE — Therapy (Signed)
[facility] [address] [place], [place], [address] Phone: [phone]   Fax:  [phone]  Physical Therapy Treatment  Patient Details  Name: Travis Aguilar MRN: [id number] Date of Birth: 1990/12/16 Referring Provider:  Sheran Luz, MD  Encounter Date: 02/21/2015      PT End of Session - 02/21/15 1748    Visit Number 9   Number of Visits 16   Date for PT Re-Evaluation 03/08/15   Authorization Type BCBS   Authorization - Visit Number 9   Authorization - Number of Visits 16   PT Start Time 1738   PT Stop Time 1820   PT Time Calculation (min) 42 min   Activity Tolerance Patient tolerated treatment well   Behavior During Therapy [facility] for tasks assessed/performed      Past Medical History  Diagnosis Date  . Asthma     Past Surgical History  Procedure Laterality Date  . Tonsillectomy    . Surgery of lip      age 24 or 24    There were no vitals filed for this visit.  Visit Diagnosis:  Midline low back pain with sciatica, sciatica laterality unspecified  Weakness of left lower extremity  Hip stiffness, left  Overexertion from lifting with repetitive movement, sequela  Weakness of trunk musculature      Subjective Assessment - 02/21/15 1746    Subjective Pt staetd he is pain free today, continues to have back pain with lifting at work.   Currently in Pain? No/denies           [facility] Adult PT Treatment/Exercise - 02/21/15 0001    Exercises   Exercises Lumbar;Knee/Hip   Lumbar Exercises: Standing   Lifting 10 reps;Weights   Lifting Weights (lbs) orange ball   Lifting Limitations proper lifting from 17in then 14in pain free   Forward Lunge 10 reps   Forward Lunge Limitations on floor   Side Lunge 10 reps   Side Lunge Limitations on floor   Wall Slides 10 reps;5 seconds   Other Standing Lumbar Exercises 3D hip excursion 10x   Lumbar Exercises: Quadruped   Opposite Arm/Leg Raise Right arm/Left leg;Left  arm/Right leg;10 reps;5 seconds   Plank 4x 15"   Knee/Hip Exercises: Standing   Wall Squat 10 reps;5 seconds   Other Standing Knee Exercises 3D hip excursion 15x   Shoulder Exercises: Seated   Other Seated Exercises 3D thoracic excursion   Shoulder Exercises: Standing   Other Standing Exercises UE flexion against wall for posture 10x             PT Short Term Goals - 02/21/15 1748    PT SHORT TERM GOAL #1   Title Patient will be able to demosntrate icnreased thoracic spien rotation too Rt to >70 degrees indicatign improved ability to look over Lt shoulder while driving.    Status Achieved   PT SHORT TERM GOAL #2   Title Patient will dmoenstrate a negative piriformis test and hamstring ROM of >80 degrees bilaterally with SLR.    Status Achieved   PT SHORT TERM GOAL #3   Title Patint will dmoenstrate increased lumbar flexion fROM > 80 degrees to more easily bend down and touch toes.    Status Achieved   PT SHORT TERM GOAL #4   Title Patient will be indepenedent with HEP   Status On-going           PT Long Term Goals - 02/21/15 1749    PT  LONG TERM GOAL #1   Title Patient will demonstrate icnreased glute max strength of 5//5 to be able to more easily lift heavy objects from te floor through his hips   Status On-going   PT LONG TERM GOAL #2   Title Patient will demonstrate icnreased hamstring strength of 5//5 to be able to more easily lift heavy objects from the floor through his knees to decrease strain on lumbar spine   Status Achieved   PT LONG TERM GOAL #3   Title patient will dmeonstrate increased abdominal and lumbar paraspinal muscle strength of 5/5 MMT indicatign increased trunk stability for lift items throughtou his lumbar spine in preventing lumbar spien strain   Status On-going   PT LONG TERM GOAL #4   Title Patiwent will be able to lift 100lb from the floor to chest height 10x in 2 minutes to be able to return to work at 100%.    Status On-going                Plan - 02/21/15 1822    Clinical Impression Statement Session focus on improving lumbar, core and LE strengthening as well as reviewing proper body mechanics.  Pt with improved stabiltiy noted today with planks and quadruped exercises today.  Pt able to demonstrate appropraite body mechanics from 17in and 14in height with no reports of increased pain this session.     PT Next Visit Plan Continue with current PT POC with focus on core, posture strengtheing and body mechanics.        Problem List Patient Active Problem List   Diagnosis Date Noted  . Pars defect of lumbar spine 08/18/2014  . Asthma, mild intermittent 06/24/2013   Becky Saxasey Anniah Glick, LPTA; CBIS [phone]  Juel BurrowCockerham, Mimie Goering Jo 02/21/2015, 6:27 PM  [facility] [address] [place], [place], [address] Phone: [phone]   Fax:  [phone]

## 2015-02-23 ENCOUNTER — Ambulatory Visit (HOSPITAL_COMMUNITY): Payer: BLUE CROSS/BLUE SHIELD

## 2015-02-23 DIAGNOSIS — M5440 Lumbago with sciatica, unspecified side: Secondary | ICD-10-CM

## 2015-02-23 DIAGNOSIS — R29898 Other symptoms and signs involving the musculoskeletal system: Secondary | ICD-10-CM

## 2015-02-23 DIAGNOSIS — M6281 Muscle weakness (generalized): Secondary | ICD-10-CM

## 2015-02-23 DIAGNOSIS — X500XXS Overexertion from strenuous movement or load, sequela: Secondary | ICD-10-CM

## 2015-02-23 DIAGNOSIS — M25652 Stiffness of left hip, not elsewhere classified: Secondary | ICD-10-CM

## 2015-02-23 NOTE — Therapy (Signed)
[facility] [address] [place], [place], [address] Phone: [phone]   Fax:  [phone]  Physical Therapy Treatment  Patient Details  Name: Travis Aguilar MRN: [id number] Date of Birth: 1991-05-10 Referring Provider:  Merlyn Albert, MD  Encounter Date: 02/23/2015      PT End of Session - 02/23/15 1757    Visit Number 10   Number of Visits 16   Date for PT Re-Evaluation 03/08/15   Authorization Type BCBS   Authorization - Visit Number 10   Authorization - Number of Visits 16   PT Start Time 1735   PT Stop Time 1818   PT Time Calculation (min) 43 min   Activity Tolerance Patient tolerated treatment well   Behavior During Therapy [facility] for tasks assessed/performed      Past Medical History  Diagnosis Date  . Asthma     Past Surgical History  Procedure Laterality Date  . Tonsillectomy    . Surgery of lip      age 54 or 80    There were no vitals filed for this visit.  Visit Diagnosis:  Midline low back pain with sciatica, sciatica laterality unspecified  Weakness of left lower extremity  Hip stiffness, left  Overexertion from lifting with repetitive movement, sequela  Weakness of trunk musculature      Subjective Assessment - 02/23/15 1735    Subjective Pt stated he is pain free today, continues to c/o pain with lifting at work   Currently in Pain? No/denies              [facility] Adult PT Treatment/Exercise - 02/23/15 0001    Lumbar Exercises: Stretches   Active Hamstring Stretch 3 reps;30 seconds   Active Hamstring Stretch Limitations 14 instep   Lumbar Exercises: Standing   Lifting 10 reps;Weights   Lifting Weights (lbs) orange ball   Lifting Limitations proper lifting from 17in then 14in pain free; 12in and from floor 5x    Forward Lunge 10 reps   Forward Lunge Limitations on floor   Side Lunge 10 reps   Side Lunge Limitations on floor   Wall Slides 10 reps;5 seconds   Other Standing Lumbar  Exercises 4 way pick up and reach 10x 8lb   Other Standing Lumbar Exercises Overhead dumbbell matrix 4# 5x; wall pushups 15x 3"; 3D hip excursion 10x   Lumbar Exercises: Quadruped   Opposite Arm/Leg Raise Right arm/Left leg;Left arm/Right leg;10 reps;5 seconds   Plank 4x 20", Side planks 2x 10" each             PT Short Term Goals - 02/23/15 1808    PT SHORT TERM GOAL #1   Title Patient will be able to demosntrate icnreased thoracic spien rotation too Rt to >70 degrees indicatign improved ability to look over Lt shoulder while driving.    Status Achieved   PT SHORT TERM GOAL #2   Title Patient will dmoenstrate a negative piriformis test and hamstring ROM of >80 degrees bilaterally with SLR.    Status Achieved   PT SHORT TERM GOAL #3   Title Patint will dmoenstrate increased lumbar flexion fROM > 80 degrees to more easily bend down and touch toes.    Status Achieved   PT SHORT TERM GOAL #4   Title Patient will be indepenedent with HEP   Status On-going           PT Long Term Goals - 02/23/15 1808    PT LONG  TERM GOAL #1   Title Patient will demonstrate icnreased glute max strength of 5//5 to be able to more easily lift heavy objects from te floor through his hips   Status On-going   PT LONG TERM GOAL #2   Title Patient will demonstrate icnreased hamstring strength of 5//5 to be able to more easily lift heavy objects from the floor through his knees to decrease strain on lumbar spine   Status Achieved   PT LONG TERM GOAL #3   Title patient will dmeonstrate increased abdominal and lumbar paraspinal muscle strength of 5/5 MMT indicatign increased trunk stability for lift items throughtou his lumbar spine in preventing lumbar spien strain   Status On-going   PT LONG TERM GOAL #4   Title Patient will be able to lift 100lb tire from the floor and will be able to lift it to at least shoulder height four times without pain in order to allow him to return to work at 100%.   Status  Revised               Plan - 02/23/15 1757    Clinical Impression Statement Session focus on core strenghtening and body mechanics with lifting in pain free range.  Progressed core strenghtening with new activities including overhead dumbbell matrix, wall push up with abdominal sets and side planks.  Noted instability due to core weakness.  Pt able to demonstrate appropraite body mechanics lifting theraball from 17 and 14 in height without pain, pt required cueing for proper lifting techniques for lower heights due to improper mechanics.   PT Next Visit Plan Continue with current PT POC with focus on core, posture strengtheing and body mechanics.        Problem List Patient Active Problem List   Diagnosis Date Noted  . Pars defect of lumbar spine 08/18/2014  . Asthma, mild intermittent 06/24/2013   Becky Saxasey [place]ockerham, LPTA; CBIS [phone]  Juel BurrowCockerham, Casey Jo 02/23/2015, 6:17 PM  [facility] [address] [place], [place], [address] Phone: [phone]   Fax:  [phone]

## 2015-02-28 ENCOUNTER — Ambulatory Visit (HOSPITAL_COMMUNITY): Payer: BLUE CROSS/BLUE SHIELD

## 2015-02-28 DIAGNOSIS — M25652 Stiffness of left hip, not elsewhere classified: Secondary | ICD-10-CM

## 2015-02-28 DIAGNOSIS — R29898 Other symptoms and signs involving the musculoskeletal system: Secondary | ICD-10-CM

## 2015-02-28 DIAGNOSIS — M5440 Lumbago with sciatica, unspecified side: Secondary | ICD-10-CM

## 2015-02-28 DIAGNOSIS — X500XXS Overexertion from strenuous movement or load, sequela: Secondary | ICD-10-CM

## 2015-02-28 DIAGNOSIS — M6281 Muscle weakness (generalized): Secondary | ICD-10-CM

## 2015-02-28 NOTE — Therapy (Signed)
[facility] [address] [place], [place], [address] Phone: [phone]   Fax:  [phone]  Physical Therapy Treatment  Patient Details  Name: Shellia CarwinSteven Tygart MRN: [id number] Date of Birth: 31-Oct-1990 Referring Provider:  Sheran Luzamos, Richard, MD  Encounter Date: 02/28/2015      PT End of Session - 02/28/15 1831    Visit Number 11   Number of Visits 16   Date for PT Re-Evaluation 03/08/15   Authorization Type BCBS   Authorization - Visit Number 11   Authorization - Number of Visits 16   PT Start Time 1735   PT Stop Time 1820   PT Time Calculation (min) 45 min   Activity Tolerance Patient tolerated treatment well   Behavior During Therapy [facility] for tasks assessed/performed      Past Medical History  Diagnosis Date  . Asthma     Past Surgical History  Procedure Laterality Date  . Tonsillectomy    . Surgery of lip      age 24 or [age]    There were no vitals filed for this visit.  Visit Diagnosis:  Midline low back pain with sciatica, sciatica laterality unspecified  Weakness of left lower extremity  Hip stiffness, left  Overexertion from lifting with repetitive movement, sequela  Weakness of trunk musculature      Subjective Assessment - 02/28/15 1738    Subjective Pt stated pain free today, continues to c/o pain with lifting at work   Currently in Pain? No/denies                         [facility] Adult [facility] Treatment/Exercise - 02/28/15 0001    Lumbar Exercises: Stretches   Prone on Elbows Stretch 2 reps;60 seconds   Lumbar Exercises: Standing   Functional Squats Limitations 4 sets: Normal squats, dead lift 2#, deep squats with ab sets, squat with dowel rod overhead   Wall Slides 10 reps;5 seconds   Other Standing Lumbar Exercises 4 way pick up and reach 10x 8lb   Lumbar Exercises: Seated   Other Seated Lumbar Exercises 3D thoracic spine excursiosn with UE overhead reaches 10x each   Lumbar Exercises:  Quadruped   Opposite Arm/Leg Raise Right arm/Left leg;Left arm/Right leg;10 reps;5 seconds   Plank 4x 20", Side planks 2x 10" each                  PT Short Term Goals - 02/23/15 1808    PT SHORT TERM GOAL #1   Title Patient will be able to demosntrate icnreased thoracic spien rotation too Rt to >70 degrees indicatign improved ability to look over Lt shoulder while driving.    Status Achieved   PT SHORT TERM GOAL #2   Title Patient will dmoenstrate a negative piriformis test and hamstring ROM of >80 degrees bilaterally with SLR.    Status Achieved   PT SHORT TERM GOAL #3   Title Patint will dmoenstrate increased lumbar flexion fROM > 80 degrees to more easily bend down and touch toes.    Status Achieved   PT SHORT TERM GOAL #4   Title Patient will be indepenedent with HEP   Status On-going           PT Long Term Goals - 02/28/15 1834    PT LONG TERM GOAL #1   Title Patient will demonstrate icnreased glute max strength of 5//5 to be able to more easily lift heavy objects from te  floor through his hips   Status On-going   PT LONG TERM GOAL #3   Title patient will dmeonstrate increased abdominal and lumbar paraspinal muscle strength of 5/5 MMT indicatign increased trunk stability for lift items throughtou his lumbar spine in preventing lumbar spien strain   Status On-going   PT LONG TERM GOAL #4   Title Patient will be able to lift 100lb tire from the floor and will be able to lift it to at least shoulder height four times without pain in order to allow him to return to work at 100%.   Status On-going               Plan - 02/28/15 1831    Clinical Impression Statement Continued session focus on improving core strengthening and body mechanics.  Pt able to demosntrate improved stabilty with planks.  Added dead lifts and overhead squats to improve core stability wtih proper lifting, pt able to complete with min cueing for form.  Pt stated pain scale 1/10 following  multiple reps.  Ended session with POE with reoports of pain resolved.   PT Next Visit Plan Continue with current PT POC with focus on core, posture strengtheing and body mechanics.        Problem List Patient Active Problem List   Diagnosis Date Noted  . Pars defect of lumbar spine 08/18/2014  . Asthma, mild intermittent 06/24/2013   Becky Sax, LPTA; CBIS [phone]  Juel Burrow 02/28/2015, 6:37 PM  [facility] [address] [place], [place], [address] Phone: [phone]   Fax:  [phone]

## 2015-03-02 ENCOUNTER — Ambulatory Visit (HOSPITAL_COMMUNITY): Payer: BLUE CROSS/BLUE SHIELD

## 2015-03-02 DIAGNOSIS — M6281 Muscle weakness (generalized): Secondary | ICD-10-CM

## 2015-03-02 DIAGNOSIS — X503XXS Overexertion from repetitive movements, sequela: Secondary | ICD-10-CM

## 2015-03-02 DIAGNOSIS — R29898 Other symptoms and signs involving the musculoskeletal system: Secondary | ICD-10-CM

## 2015-03-02 DIAGNOSIS — M5440 Lumbago with sciatica, unspecified side: Secondary | ICD-10-CM

## 2015-03-02 DIAGNOSIS — X500XXS Overexertion from strenuous movement or load, sequela: Secondary | ICD-10-CM

## 2015-03-02 DIAGNOSIS — M25652 Stiffness of left hip, not elsewhere classified: Secondary | ICD-10-CM

## 2015-03-02 NOTE — Therapy (Signed)
[facility] [address] [place], [place], [address] Phone: [phone]   Fax:  [phone]  Physical Therapy Treatment  Patient Details  Name: Travis Aguilar MRN: [id number] Date of Birth: [birth year] Referring Provider:  Sheran Luz, MD  Encounter Date: 03/02/2015      PT End of Session - 03/02/15 1828    Visit Number 12   Number of Visits 16   Date for PT Re-Evaluation 03/08/15   Authorization Type BCBS   Authorization - Visit Number 12   Authorization - Number of Visits 16   PT Start Time 1735   PT Stop Time 1820   PT Time Calculation (min) 45 min   Activity Tolerance Patient tolerated treatment well   Behavior During Therapy [facility] for tasks assessed/performed      Past Medical History  Diagnosis Date  . Asthma     Past Surgical History  Procedure Laterality Date  . Tonsillectomy    . Surgery of lip      age 24 or 24    There were no vitals filed for this visit.  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   Currently in Pain? No/denies              [facility] Adult PT Treatment/Exercise - 03/02/15 0001    Exercises   Exercises Lumbar;Knee/Hip   Lumbar Exercises: Stretches   Active Hamstring Stretch 3 reps;30 seconds   Active Hamstring Stretch Limitations 14 instep   Piriformis Stretch 3 reps;30 seconds   Piriformis Stretch Limitations seated   Lumbar Exercises: Standing   Functional Squats Limitations 4 sets: Normal squats, dead lift 2#, deep squats with ab sets, squat with dowel rod overhead   Lifting 10 reps;Weights   Lifting Weights (lbs) orange ball   Lifting  Limitations proper lifting from 17in then 14in and12in and from floor 5x ; all pain free   Forward Lunge 10 reps   Forward Lunge Limitations on floor   Wall Slides 10 reps  10" holds   Other Standing Lumbar Exercises 4 way pick up and reach 10x 8lb   Lumbar Exercises: Quadruped   Opposite Arm/Leg Raise Right arm/Left leg;Left arm/Right leg;10 reps;5 seconds   Plank 3x 30", 25", 20"                  PT Short Term Goals - 02/23/15 1808    PT SHORT TERM GOAL #1   Title Patient will be able to demosntrate icnreased thoracic spien rotation too Rt to >70 degrees indicatign improved ability to look over Lt shoulder while driving.    Status Achieved   PT SHORT TERM GOAL #2   Title Patient will dmoenstrate a negative piriformis test and hamstring ROM of >80 degrees bilaterally with SLR.    Status Achieved   PT SHORT TERM GOAL #3   Title Patint will dmoenstrate increased lumbar flexion fROM > 80 degrees to more easily bend down and touch toes.    Status Achieved   PT SHORT TERM GOAL #4   Title Patient will be indepenedent with HEP   Status On-going  PT Long Term Goals - 02/28/15 1834    PT LONG TERM GOAL #1   Title Patient will demonstrate icnreased glute max strength of 5//5 to be able to more easily lift heavy objects from te floor through his hips   Status On-going   PT LONG TERM GOAL #3   Title patient will dmeonstrate increased abdominal and lumbar paraspinal muscle strength of 5/5 MMT indicatign increased trunk stability for lift items throughtou his lumbar spine in preventing lumbar spien strain   Status On-going   PT LONG TERM GOAL #4   Title Patient will be able to lift 100lb tire from the floor and will be able to lift it to at least shoulder height four times without pain in order to allow him to return to work at 100%.   Status On-going               Plan - 03/02/15 1828    Clinical Impression Statement Continued session focus on improving  proximal musculature and proper body mechanics.  Pt with increased fatigue through session requiring extra cueing for stabiltiy with exercises. Increased difficulty with planks this session due to fatigues.  Pt able to complete proper lifting from the floor 8 reps with no reports of pain and min cueing for ab sets prior lifting.  No reports of pain through session.     PT Next Visit Plan Continue with current PT POC with focus on core, posture strengtheing and body mechanics.        Problem List Patient Active Problem List   Diagnosis Date Noted  . Pars defect of lumbar spine 08/18/2014  . Asthma, mild intermittent 06/24/2013   Becky Sax, LPTA; CBIS [phone]  Juel Burrow 03/02/2015, 6:31 PM  [facility] [address] [place], [place], [address] Phone: [phone]   Fax:  [phone]

## 2015-03-07 ENCOUNTER — Ambulatory Visit (HOSPITAL_COMMUNITY): Payer: BLUE CROSS/BLUE SHIELD

## 2015-03-07 DIAGNOSIS — X500XXS Overexertion from strenuous movement or load, sequela: Secondary | ICD-10-CM

## 2015-03-07 DIAGNOSIS — M25652 Stiffness of left hip, not elsewhere classified: Secondary | ICD-10-CM

## 2015-03-07 DIAGNOSIS — M6281 Muscle weakness (generalized): Secondary | ICD-10-CM

## 2015-03-07 DIAGNOSIS — M5440 Lumbago with sciatica, unspecified side: Secondary | ICD-10-CM

## 2015-03-07 DIAGNOSIS — R29898 Other symptoms and signs involving the musculoskeletal system: Secondary | ICD-10-CM

## 2015-03-07 NOTE — Therapy (Signed)
[facility] [address] [place], [place], [address] Phone: [phone]   Fax:  [phone]  Physical Therapy Treatment  Patient Details  Name: Travis Aguilar MRN: [id number] Date of Birth: 22-Nov-1990 Referring Provider:  Merlyn Albert, MD  Encounter Date: 03/07/2015      PT End of Session - 03/07/15 1810    Visit Number 13   Number of Visits 16   Date for PT Re-Evaluation 03/08/15   Authorization Type BCBS   Authorization - Visit Number 13   Authorization - Number of Visits 16   PT Start Time 1735   PT Stop Time 1820   PT Time Calculation (min) 45 min   Activity Tolerance Patient tolerated treatment well   Behavior During Therapy [facility] for tasks assessed/performed      Past Medical History  Diagnosis Date  . Asthma     Past Surgical History  Procedure Laterality Date  . Tonsillectomy    . Surgery of lip      age 39 or 49    There were no vitals filed for this visit.  Visit Diagnosis:  Midline low back pain with sciatica, sciatica laterality unspecified  Weakness of left lower extremity  Hip stiffness, left  Overexertion from lifting with repetitive movement, sequela  Weakness of trunk musculature      Subjective Assessment - 03/07/15 1736    Subjective Pt stated pain free today, able to lift without pain, increased pain with increased reps.   Currently in Pain? No/denies              [facility] Adult PT Treatment/Exercise - 03/07/15 0001    Exercises   Exercises Lumbar;Knee/Hip   Lumbar Exercises: Stretches   Active Hamstring Stretch 3 reps;30 seconds   Active Hamstring Stretch Limitations 14 instep   Piriformis Stretch 3 reps;30 seconds   Piriformis Stretch Limitations seated   Lumbar Exercises: Standing   Functional Squats Limitations 4 sets: Normal squats, dead lift 2#, deep squats with ab sets, squat with dowel rod overhead   Lifting Limitations proper lifting 6 sets: 17in, 14in, 12in, 8in, 6in,  4in 5 reps each with core sets   Wall Slides 10 reps  10 seconds   Other Standing Lumbar Exercises 4 way pick up and reach 10x 8lb; Lunge walking 2RT   Other Standing Lumbar Exercises Overhead dumbbell matrix 4# 5x   Lumbar Exercises: Quadruped   Opposite Arm/Leg Raise Right arm/Left leg;Left arm/Right leg;10 reps;5 seconds   Plank 4x 30"   Other Quadruped Lumbar Exercises sidelying 2x 10"              PT Short Term Goals - 02/23/15 1808    PT SHORT TERM GOAL #1   Title Patient will be able to demosntrate icnreased thoracic spien rotation too Rt to >70 degrees indicatign improved ability to look over Lt shoulder while driving.    Status Achieved   PT SHORT TERM GOAL #2   Title Patient will dmoenstrate a negative piriformis test and hamstring ROM of >80 degrees bilaterally with SLR.    Status Achieved   PT SHORT TERM GOAL #3   Title Patint will dmoenstrate increased lumbar flexion fROM > 80 degrees to more easily bend down and touch toes.    Status Achieved   PT SHORT TERM GOAL #4   Title Patient will be indepenedent with HEP   Status On-going           PT Long Term Goals -  02/28/15 1834    PT LONG TERM GOAL #1   Title Patient will demonstrate icnreased glute max strength of 5//5 to be able to more easily lift heavy objects from te floor through his hips   Status On-going   PT LONG TERM GOAL #3   Title patient will dmeonstrate increased abdominal and lumbar paraspinal muscle strength of 5/5 MMT indicatign increased trunk stability for lift items throughtou his lumbar spine in preventing lumbar spien strain   Status On-going   PT LONG TERM GOAL #4   Title Patient will be able to lift 100lb tire from the floor and will be able to lift it to at least shoulder height four times without pain in order to allow him to return to work at 100%.   Status On-going               Plan - 03/07/15 1810    Clinical Impression Statement Pt improving form with proper lifting  techniques, proper lifting training complete with gradual reduction in height.  Pt able to complete lifting from 17 to 6in height with no reports of pain.  Increased pain and cueing required for lifting from 4in height to increase LE and reduce lower back lifting.  Pt is improving form and overall core stabiltiy wtih increase ease with quadruped activities and able to hold plank for 30".     PT Next Visit Plan Reassess next session.          Problem List Patient Active Problem List   Diagnosis Date Noted  . Pars defect of lumbar spine 08/18/2014  . Asthma, mild intermittent 06/24/2013   Becky Sax, LPTA; CBIS [phone]  Juel Burrow 03/07/2015, 6:19 PM  [facility] [address] [place], [place], [address] Phone: [phone]   Fax:  [phone]

## 2015-03-09 ENCOUNTER — Ambulatory Visit (HOSPITAL_COMMUNITY): Payer: BLUE CROSS/BLUE SHIELD | Admitting: Physical Therapy

## 2015-03-09 DIAGNOSIS — X503XXS Overexertion from repetitive movements, sequela: Secondary | ICD-10-CM

## 2015-03-09 DIAGNOSIS — X500XXS Overexertion from strenuous movement or load, sequela: Secondary | ICD-10-CM

## 2015-03-09 DIAGNOSIS — M6281 Muscle weakness (generalized): Secondary | ICD-10-CM

## 2015-03-09 DIAGNOSIS — M25652 Stiffness of left hip, not elsewhere classified: Secondary | ICD-10-CM

## 2015-03-09 DIAGNOSIS — M5440 Lumbago with sciatica, unspecified side: Secondary | ICD-10-CM

## 2015-03-09 DIAGNOSIS — R29898 Other symptoms and signs involving the musculoskeletal system: Secondary | ICD-10-CM

## 2015-03-09 NOTE — Therapy (Signed)
[facility] [address] [place], [place], [address] Phone: [phone]   Fax:  [phone]  Physical Therapy Treatment (Re-Assessment)  Patient Details  Name: Travis Aguilar MRN: [id number] Date of Birth: 12-03-1990 Referring Provider:  Sheran Luz, MD  Encounter Date: 03/09/2015      PT End of Session - 03/09/15 1824    Visit Number 14   Number of Visits 21   Date for PT Re-Evaluation 04/06/15   Authorization Type BCBS   Authorization - Visit Number 14   Authorization - Number of Visits 21   PT Start Time 1732   PT Stop Time 1820   PT Time Calculation (min) 48 min   Activity Tolerance Patient tolerated treatment well   Behavior During Therapy [facility] for tasks assessed/performed      Past Medical History  Diagnosis Date  . Asthma     Past Surgical History  Procedure Laterality Date  . Tonsillectomy    . Surgery of lip      age 42 or 89    There were no vitals filed for this visit.  Visit Diagnosis:  Midline low back pain with sciatica, sciatica laterality unspecified - Plan: PT plan of care cert/re-cert  Weakness of left lower extremity - Plan: PT plan of care cert/re-cert  Hip stiffness, left - Plan: PT plan of care cert/re-cert  Overexertion from lifting with repetitive movement, sequela - Plan: PT plan of care cert/re-cert  Weakness of trunk musculature - Plan: PT plan of care cert/re-cert      Subjective Assessment - 03/09/15 1735    Subjective Patient reports he is pain free today; has pain if he stays down too long it hurts; if he goes fast he doesn't have pain    Pertinent History Patient has had 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 with radiating pain down bilateral legs inconsistently. no improvements ove rthe last year. noted no relief with rest or pain medication. pain predominantly with bending over, pain with prolonged sleeping wihtotu back  support.    Patient Stated Goals to decrease pain, to be able to be stonger to lift > 100lb to lift to chest height.    Currently in Pain? No/denies            [facility] PT Assessment - 03/09/15 0001    Assessment   Medical Diagnosis back pain,    Onset Date/Surgical Date 11/04/14   Next MD Visit Ardyth Gal unscheduled   Prior Therapy No   Observation/Other Assessments   Observations Core strenth approx 4/5    Focus on Therapeutic Outcomes (FOTO)  39% limited    AROM   Right Hip External Rotation  --  WNL    Right Hip Internal Rotation  45   Left Hip External Rotation  --  WNL    Left Hip Internal Rotation  45   Right Ankle Dorsiflexion 20   Left Ankle Dorsiflexion 20   Lumbar Flexion 90   Lumbar Extension --  WNL    Thoracic - Right Rotation 90   Thoracic - Left Rotation 90   Strength   Right Hip Flexion 4+/5   Right Hip Extension 4+/5   Right Hip ABduction 5/5   Left Hip Flexion 5/5   Left Hip Extension 4+/5   Left Hip ABduction 4+/5   Right Knee Flexion 5/5   Right Knee Extension 5/5   Left Knee  Flexion 5/5   Left Knee Extension 5/5   Right Ankle Dorsiflexion 5/5   Left Ankle Dorsiflexion 5/5                     [facility] Adult PT Treatment/Exercise - 03/09/15 0001    Lumbar Exercises: Stretches   Active Hamstring Stretch 3 reps;30 seconds   Active Hamstring Stretch Limitations 14 instep   Passive Hamstring Stretch 3 reps;30 seconds   Passive Hamstring Stretch Limitations gastroc stretch    Piriformis Stretch 3 reps;30 seconds   Piriformis Stretch Limitations seated   Lumbar Exercises: Seated   Other Seated Lumbar Exercises Planks: 1x30 seconds static hold; 1x10 hip thrusts in plank ; gentle Guernsey twist with red ball 1x10                PT Education - 03/09/15 1824    Education provided Yes   Education Details plan of care moving forward, progress with skilled PT services    Person(s) Educated Patient   Methods Explanation    Comprehension Verbalized understanding          PT Short Term Goals - 03/09/15 1801    PT SHORT TERM GOAL #1   Title Patient will be able to demosntrate icnreased thoracic spien rotation too Rt to >70 degrees indicatign improved ability to look over Lt shoulder while driving.    Time 4   Period Weeks   Status Achieved   PT SHORT TERM GOAL #2   Title Patient will dmoenstrate a negative piriformis test and hamstring ROM of >80 degrees bilaterally with SLR.    Time 4   Period Weeks   Status Achieved   PT SHORT TERM GOAL #3   Title Patint will dmoenstrate increased lumbar flexion fROM > 80 degrees to more easily bend down and touch toes.    Time 4   Period Weeks   Status Achieved   PT SHORT TERM GOAL #4   Title Patient will be indepenedent with HEP   Baseline 7/28- reports he is doing it every day most of the time but does slack sometimes    Time 4   Period Weeks   Status Achieved           PT Long Term Goals - 03/09/15 1803    PT LONG TERM GOAL #1   Title Patient will demonstrate icnreased glute max strength of 5//5 to be able to more easily lift heavy objects from te floor through his hips   Time 8   Period Weeks   Status On-going   PT LONG TERM GOAL #2   Title Patient will demonstrate icnreased hamstring strength of 5//5 to be able to more easily lift heavy objects from the floor through his knees to decrease strain on lumbar spine   Time 8   Period Weeks   Status Achieved   PT LONG TERM GOAL #3   Title patient will dmeonstrate increased abdominal and lumbar paraspinal muscle strength of 5/5 MMT indicatign increased trunk stability for lift items throughtou his lumbar spine in preventing lumbar spien strain   Time 8   Period Weeks   Status On-going   PT LONG TERM GOAL #4   Title Patient will be able to lift 100lb tire from the floor and will be able to lift it to at least shoulder height four times without pain in order to allow him to return to work at 100%.    Baseline 7/28- patient reports that he  is still getting help for 100 pound tires    Time 8   Period Weeks   Status On-going               Plan - 03/09/15 1825    Clinical Impression Statement Re-assessment performed today. Patient is improving in strength and mobility however continues to demonstrate pain with repetitive lifting at work, possibly due to abdominal and core reduced endurance. At this time patinet will benefit from continued skilled PT services in order to address his impairemnts, develop advanced HEP, and optimize level of function.    Pt will benefit from skilled therapeutic intervention in order to improve on the following deficits Abnormal gait;Decreased strength;Increased muscle spasms;Improper body mechanics;Difficulty walking;Postural dysfunction;Impaired flexibility;Pain;Hypermobility;Impaired UE functional use;Decreased endurance   Rehab Potential Good   PT Frequency 2x / week   PT Duration 3 weeks   PT Treatment/Interventions Gait training;Functional mobility training;Manual techniques;Therapeutic exercise;Patient/family education   PT Next Visit Plan Focus on core endurace tasks- IE dynamic plankes, russian twists; continue to reduce height patient is lifting from    PT Home Exercise Plan lunges, 3D hip excursion   Consulted and Agree with Plan of Care Patient        Problem List Patient Active Problem List   Diagnosis Date Noted  . Pars defect of lumbar spine 08/18/2014  . Asthma, mild intermittent 06/24/2013    Physical Therapy Progress Note  Dates of Reporting Period: 02/08/15 to 03/09/15  Objective Reports of Subjective Statement: see above   Objective Measurements: see above   Goal Update: see above   Plan: see above   Reason Skilled Services are Required: reduced core endurance, continued pain, develop advanced HEP    Nedra Hai PT, DPT [phone]     [facility] [address]  [place], [place], [address] Phone: [phone]   Fax:  [phone]

## 2015-03-16 ENCOUNTER — Ambulatory Visit (HOSPITAL_COMMUNITY): Payer: BLUE CROSS/BLUE SHIELD | Attending: Physical Medicine and Rehabilitation | Admitting: Physical Therapy

## 2015-03-16 DIAGNOSIS — R29898 Other symptoms and signs involving the musculoskeletal system: Secondary | ICD-10-CM

## 2015-03-16 DIAGNOSIS — X500XXS Overexertion from strenuous movement or load, sequela: Secondary | ICD-10-CM

## 2015-03-16 DIAGNOSIS — M25652 Stiffness of left hip, not elsewhere classified: Secondary | ICD-10-CM

## 2015-03-16 DIAGNOSIS — M5440 Lumbago with sciatica, unspecified side: Secondary | ICD-10-CM

## 2015-03-16 DIAGNOSIS — X503XXS Overexertion from repetitive movements, sequela: Secondary | ICD-10-CM

## 2015-03-16 DIAGNOSIS — M6281 Muscle weakness (generalized): Secondary | ICD-10-CM

## 2015-03-16 NOTE — Therapy (Signed)
[facility] [address] [place], [place], [address] Phone: [phone]   Fax:  [phone]  Physical Therapy Treatment  Patient Details  Name: Travis Aguilar MRN: [id number] Date of Birth: November 10, 1990 Referring Provider:  Merlyn Albert, MD  Encounter Date: 03/16/2015      PT End of Session - 03/16/15 1821    Visit Number 15   Number of Visits 21   Date for PT Re-Evaluation 04/06/15   Authorization Type [employer]   Authorization - Visit Number 15   Authorization - Number of Visits 21   PT Start Time 1736   PT Stop Time 1814   PT Time Calculation (min) 38 min   Activity Tolerance Patient tolerated treatment well   Behavior During Therapy [facility] for tasks assessed/performed      Past Medical History  Diagnosis Date  . Asthma     Past Surgical History  Procedure Laterality Date  . Tonsillectomy    . Surgery of lip      age 43 or 54    There were no vitals filed for this visit.  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 with radiating pain down bilateral legs inconsistently. no improvements ove rthe last year. noted no relief with rest or pain medication. pain predominantly with bending over, pain with prolonged sleeping wihtotu back support.    Currently in Pain? No/denies                         [facility] Adult PT Treatment/Exercise - 03/16/15 0001    Lumbar Exercises: Stretches   Active Hamstring Stretch 2 reps;30 seconds   Active Hamstring  Stretch Limitations 14 inch step    Passive Hamstring Stretch 3 reps;30 seconds   Passive Hamstring Stretch Limitations gastroc stretch    Quad Stretch 2 reps;30 seconds   Quad Stretch Limitations standing    Lumbar Exercises: Standing   Other Standing Lumbar Exercises Functional liftign with 25# in box; no pain with straight plane lifts however rotation lifts provoked pain especially on L side    Lumbar Exercises: Quadruped   Plank 3x30"; plank pushups from knees 2x5; attempted plank LE ABD but unable due to poor form     Other Quadruped Lumbar Exercises Scorpions and quadruped hip ABD    Other Quadruped Lumbar Exercises Plank waves on arms 1x10   Knee/Hip Exercises: Standing   Other Standing Knee Exercises Sit to stand staggered stance with blue ball press up 1x10 each leg    Other Standing Knee Exercises Stork exercise with ball 1x10 each leg, core set; planks against parallel bar walking sideways 4 reps                 PT Education - 03/16/15 1821    Education provided No          PT Short Term Goals - 03/09/15 1801    PT SHORT TERM GOAL #1   Title Patient will be able  to demosntrate icnreased thoracic spien rotation too Rt to >70 degrees indicatign improved ability to look over Lt shoulder while driving.    Time 4   Period Weeks   Status Achieved   PT SHORT TERM GOAL #2   Title Patient will dmoenstrate a negative piriformis test and hamstring ROM of >80 degrees bilaterally with SLR.    Time 4   Period Weeks   Status Achieved   PT SHORT TERM GOAL #3   Title Patint will dmoenstrate increased lumbar flexion fROM > 80 degrees to more easily bend down and touch toes.    Time 4   Period Weeks   Status Achieved   PT SHORT TERM GOAL #4   Title Patient will be indepenedent with HEP   Baseline 7/28- reports he is doing it every day most of the time but does slack sometimes    Time 4   Period Weeks   Status Achieved           PT Long Term Goals - 03/09/15 1803     PT LONG TERM GOAL #1   Title Patient will demonstrate icnreased glute max strength of 5//5 to be able to more easily lift heavy objects from te floor through his hips   Time 8   Period Weeks   Status On-going   PT LONG TERM GOAL #2   Title Patient will demonstrate icnreased hamstring strength of 5//5 to be able to more easily lift heavy objects from the floor through his knees to decrease strain on lumbar spine   Time 8   Period Weeks   Status Achieved   PT LONG TERM GOAL #3   Title patient will dmeonstrate increased abdominal and lumbar paraspinal muscle strength of 5/5 MMT indicatign increased trunk stability for lift items throughtou his lumbar spine in preventing lumbar spien strain   Time 8   Period Weeks   Status On-going   PT LONG TERM GOAL #4   Title Patient will be able to lift 100lb tire from the floor and will be able to lift it to at least shoulder height four times without pain in order to allow him to return to work at 100%.   Baseline 7/28- patient reports that he is still getting help for 100 pound tires    Time 8   Period Weeks   Status On-going               Plan - 03/16/15 1821    Clinical Impression Statement Introduced functional plank exercises today in order to begin to improve functional  core endurance and strength. Attempted functional lifting after core work in attempt to facilitate carry over of core activation to reduce pain with lift, however rotation lift especially on L side continues to provioke pain.    Pt will benefit from skilled therapeutic intervention in order to improve on the following deficits Abnormal gait;Decreased strength;Increased muscle spasms;Improper body mechanics;Difficulty walking;Postural dysfunction;Impaired flexibility;Pain;Hypermobility;Impaired UE functional use;Decreased endurance   Rehab Potential Good   PT Frequency 2x / week   PT Duration 3 weeks   PT Treatment/Interventions Gait training;Functional mobility  training;Manual techniques;Therapeutic exercise;Patient/family education   PT Next Visit Plan Focus on core endurace tasks- IE dynamic plankes, russian twists; continue to reduce height patient is lifting from    PT Home Exercise Plan lunges, 3D hip excursion   Consulted and Agree with Plan of Care Patient        Problem List Patient Active Problem List  Diagnosis Date Noted  . Pars defect of lumbar spine 08/18/2014  . Asthma, mild intermittent 06/24/2013    Nedra Hai PT, DPT [phone]  [facility] [address] [place], [place], [address] Phone: [phone]   Fax:  [phone]

## 2015-03-20 ENCOUNTER — Telehealth (HOSPITAL_COMMUNITY): Payer: Self-pay

## 2015-03-20 NOTE — Telephone Encounter (Signed)
He has lost his insurance and cx these apptments he wants to come back on 8/23 and 8/25 please do not discharge him at this time.

## 2015-03-21 ENCOUNTER — Encounter (HOSPITAL_COMMUNITY): Payer: Self-pay

## 2015-03-23 ENCOUNTER — Encounter (HOSPITAL_COMMUNITY): Payer: Self-pay | Admitting: Physical Therapy

## 2015-03-28 ENCOUNTER — Encounter (HOSPITAL_COMMUNITY): Payer: Self-pay

## 2015-03-29 ENCOUNTER — Encounter (HOSPITAL_COMMUNITY): Payer: Self-pay | Admitting: Physical Therapy

## 2015-04-03 ENCOUNTER — Telehealth (HOSPITAL_COMMUNITY): Payer: Self-pay | Admitting: Physical Therapy

## 2015-04-04 ENCOUNTER — Ambulatory Visit (HOSPITAL_COMMUNITY): Payer: BLUE CROSS/BLUE SHIELD

## 2015-04-06 ENCOUNTER — Encounter (HOSPITAL_COMMUNITY): Payer: Self-pay | Admitting: Physical Therapy

## 2015-09-06 ENCOUNTER — Ambulatory Visit (INDEPENDENT_AMBULATORY_CARE_PROVIDER_SITE_OTHER): Payer: BLUE CROSS/BLUE SHIELD | Admitting: Family Medicine

## 2015-09-06 ENCOUNTER — Encounter: Payer: Self-pay | Admitting: Family Medicine

## 2015-09-06 VITALS — BP 122/74 | Temp 98.2°F | Ht 66.5 in | Wt 265.2 lb

## 2015-09-06 DIAGNOSIS — J111 Influenza due to unidentified influenza virus with other respiratory manifestations: Secondary | ICD-10-CM

## 2015-09-06 DIAGNOSIS — J019 Acute sinusitis, unspecified: Secondary | ICD-10-CM

## 2015-09-06 DIAGNOSIS — J4520 Mild intermittent asthma, uncomplicated: Secondary | ICD-10-CM

## 2015-09-06 DIAGNOSIS — B9689 Other specified bacterial agents as the cause of diseases classified elsewhere: Secondary | ICD-10-CM

## 2015-09-06 MED ORDER — ALBUTEROL SULFATE HFA 108 (90 BASE) MCG/ACT IN AERS: INHALATION_SPRAY | RESPIRATORY_TRACT | Status: DC

## 2015-09-06 MED ORDER — FLUTICASONE-SALMETEROL 250-50 MCG/DOSE IN AEPB: INHALATION_SPRAY | RESPIRATORY_TRACT | Status: DC

## 2015-09-06 MED ORDER — DOXYCYCLINE HYCLATE 100 MG PO CAPS: 100.0000 mg | ORAL_CAPSULE | Freq: Two times a day (BID) | ORAL | Status: DC

## 2015-09-06 NOTE — Progress Notes (Signed)
   Subjective:    Patient ID: Travis Aguilar, male    DOB: [birth year], 26 y.o.   MRN: [id number]  Cough This is a new problem. The current episode started in the past 7 days. The cough is productive of sputum. Associated symptoms include ear pain, a fever, headaches, rhinorrhea, a sore throat and wheezing. Pertinent negatives include no chest pain. Treatments tried: Tylenol, Nyquil, Nebulizer treatment. The treatment provided mild relief.    Patient states no other concerns this visit.  PMH benign. Does have stable asthma needs refill on his medicines  Review of Systems  Constitutional: Positive for fever. Negative for activity change.  HENT: Positive for congestion, ear pain, rhinorrhea and sore throat.   Eyes: Negative for discharge.  Respiratory: Positive for cough and wheezing.   Cardiovascular: Negative for chest pain.  Neurological: Positive for headaches.       Objective:   Physical Exam  Constitutional: He appears well-developed.  HENT:  Head: Normocephalic.  Mouth/Throat: Oropharynx is clear and moist. No oropharyngeal exudate.  Neck: Normal range of motion.  Cardiovascular: Normal rate, regular rhythm and normal heart sounds.   No murmur heard. Pulmonary/Chest: Effort normal and breath sounds normal. He has no wheezes.  Lymphadenopathy:    He has no cervical adenopathy.  Neurological: He exhibits normal muscle tone.  Skin: Skin is warm and dry.  Nursing note and vitals reviewed.         Assessment & Plan:  Influenza-the patient was diagnosed with influenza. Patient/family educated about the flu and warning signs to watch for. If difficulty breathing, severe neck pain and stiffness, cyanosis, disorientation, or progressive worsening then immediately get rechecked at that ER. If progressive symptoms be certain to be rechecked. Supportive measures such as Tylenol/ibuprofen was discussed. No aspirin use in children. And influenza home care instruction sheet was  given.  Patient was seen today for upper respiratory illness. It is felt that the patient is dealing with sinusitis. Antibiotics were prescribed today. Importance of compliance with medication was discussed. Symptoms should gradually resolve over the course of the next several days. If high fevers, progressive illness, difficulty breathing, worsening condition or failure for symptoms to improve over the next several days then the patient is to follow-up. If any emergent conditions the patient is to follow-up in the emergency department otherwise to follow-up in the office.

## 2015-10-03 ENCOUNTER — Ambulatory Visit: Payer: BLUE CROSS/BLUE SHIELD | Admitting: Family Medicine

## 2015-10-04 ENCOUNTER — Ambulatory Visit: Payer: BLUE CROSS/BLUE SHIELD | Admitting: Family Medicine

## 2015-10-09 ENCOUNTER — Ambulatory Visit: Payer: BLUE CROSS/BLUE SHIELD | Admitting: Family Medicine

## 2015-12-04 NOTE — Therapy (Signed)
[place], [place], [address] Phone: [phone]   Fax:  [phone]  Patient Details  Name: Travis Aguilar MRN: [id number] Date of Birth: [birth year] Referring Provider:  Mikey Kirschner, MD  Encounter Date: 12/04/2015   PHYSICAL THERAPY DISCHARGE SUMMARY  Visits from Start of Care: 15  Current functional level related to goals / functional outcomes: Skilled PT services terminated due to  Loss of insurance    Remaining deficits: Unable to assess    Education / Equipment: None  Plan: Patient agrees to discharge.  Patient goals were partially met. Patient is being discharged due to financial reasons.  ?????       Deniece Ree PT, DPT [facility] [address] [place], [place], [address] Phone: [phone]   Fax:  [phone]

## 2015-12-18 ENCOUNTER — Ambulatory Visit (INDEPENDENT_AMBULATORY_CARE_PROVIDER_SITE_OTHER): Payer: BLUE CROSS/BLUE SHIELD | Admitting: Family Medicine

## 2015-12-18 ENCOUNTER — Encounter (HOSPITAL_COMMUNITY): Payer: Self-pay | Admitting: Emergency Medicine

## 2015-12-18 ENCOUNTER — Encounter: Payer: Self-pay | Admitting: Family Medicine

## 2015-12-18 ENCOUNTER — Emergency Department (HOSPITAL_COMMUNITY): Payer: BLUE CROSS/BLUE SHIELD

## 2015-12-18 ENCOUNTER — Emergency Department (HOSPITAL_COMMUNITY)
Admission: EM | Admit: 2015-12-18 | Discharge: 2015-12-18 | Disposition: A | Discharge status: Home / Self Care | Payer: BLUE CROSS/BLUE SHIELD | Attending: Emergency Medicine | Admitting: Emergency Medicine

## 2015-12-18 VITALS — BP 130/80 | Temp 97.8°F | Ht 66.5 in | Wt 268.4 lb

## 2015-12-18 DIAGNOSIS — J019 Acute sinusitis, unspecified: Secondary | ICD-10-CM

## 2015-12-18 DIAGNOSIS — Z79899 Other long term (current) drug therapy: Secondary | ICD-10-CM | POA: Diagnosis not present

## 2015-12-18 DIAGNOSIS — M79661 Pain in right lower leg: Secondary | ICD-10-CM | POA: Insufficient documentation

## 2015-12-18 DIAGNOSIS — S8991XA Unspecified injury of right lower leg, initial encounter: Secondary | ICD-10-CM | POA: Diagnosis not present

## 2015-12-18 DIAGNOSIS — S86811A Strain of other muscle(s) and tendon(s) at lower leg level, right leg, initial encounter: Secondary | ICD-10-CM

## 2015-12-18 DIAGNOSIS — J45909 Unspecified asthma, uncomplicated: Secondary | ICD-10-CM | POA: Insufficient documentation

## 2015-12-18 DIAGNOSIS — J301 Allergic rhinitis due to pollen: Secondary | ICD-10-CM | POA: Diagnosis not present

## 2015-12-18 DIAGNOSIS — S86911A Strain of unspecified muscle(s) and tendon(s) at lower leg level, right leg, initial encounter: Secondary | ICD-10-CM | POA: Diagnosis not present

## 2015-12-18 MED ORDER — IBUPROFEN 800 MG PO TABS: 800.0000 mg | ORAL_TABLET | Freq: Three times a day (TID) | ORAL | Status: DC | PRN

## 2015-12-18 MED ORDER — AZITHROMYCIN 250 MG PO TABS: ORAL_TABLET | ORAL | Status: DC

## 2015-12-18 MED ORDER — FLUTICASONE-SALMETEROL 250-50 MCG/DOSE IN AEPB: INHALATION_SPRAY | RESPIRATORY_TRACT | Status: DC

## 2015-12-18 MED ORDER — TIZANIDINE HCL 4 MG PO CAPS: 4.0000 mg | ORAL_CAPSULE | Freq: Three times a day (TID) | ORAL | Status: DC

## 2015-12-18 MED ORDER — DIAZEPAM 5 MG PO TABS
10.0000 mg | ORAL_TABLET | Freq: Once | ORAL | Status: AC
  Administered 2015-12-18: 10 mg via ORAL
  Filled 2015-12-18: qty 2

## 2015-12-18 MED ORDER — IBUPROFEN 800 MG PO TABS
800.0000 mg | ORAL_TABLET | Freq: Once | ORAL | Status: AC
  Administered 2015-12-18: 800 mg via ORAL
  Filled 2015-12-18: qty 1

## 2015-12-18 NOTE — Progress Notes (Signed)
   Subjective:    Patient ID: Travis Aguilar, male    DOB: 1990/09/03, [age] y.o.   MRN: [id number]  Cough This is a new problem. The current episode started in the past 7 days. The cough is productive of sputum. Associated symptoms include ear pain, a fever, headaches, rhinorrhea, a sore throat and wheezing. Pertinent negatives include no chest pain. He has tried nothing for the symptoms.    Approximate 5 day history head congestion drainage sinus pressure not feeling good denies high fever chills sweats  Review of Systems  Constitutional: Positive for fever. Negative for activity change.  HENT: Positive for congestion, ear pain, rhinorrhea and sore throat.   Eyes: Negative for discharge.  Respiratory: Positive for cough and wheezing.   Cardiovascular: Negative for chest pain.  Neurological: Positive for headaches.       Objective:   Physical Exam  Constitutional: He appears well-developed.  HENT:  Head: Normocephalic.  Mouth/Throat: Oropharynx is clear and moist. No oropharyngeal exudate.  Neck: Normal range of motion.  Cardiovascular: Normal rate, regular rhythm and normal heart sounds.   No murmur heard. Pulmonary/Chest: Effort normal and breath sounds normal. He has no wheezes.  Lymphadenopathy:    He has no cervical adenopathy.  Neurological: He exhibits normal muscle tone.  Skin: Skin is warm and dry.  Nursing note and vitals reviewed.         Assessment & Plan:  Patient was seen today for upper respiratory illness. It is felt that the patient is dealing with sinusitis. Antibiotics were prescribed today. Importance of compliance with medication was discussed. Symptoms should gradually resolve over the course of the next several days. If high fevers, progressive illness, difficulty breathing, worsening condition or failure for symptoms to improve over the next several days then the patient is to follow-up. If any emergent conditions the patient is to follow-up in the  emergency department otherwise to follow-up in the office. No sign of any asthma aspect currently

## 2015-12-18 NOTE — Discharge Instructions (Signed)
The x-ray of your lower leg is negative for fracture or dislocation. Your examination favors an acute strain/sprain of your calf muscle. Please use the immobilizer and the ankle splint and crutches over the next 5-7 days. If not improving, please see the orthopedic specialist listed above for additional evaluation. Please use Zanaflex and ibuprofen 3 times daily with food for ear pain, spasm, and discomfort. Muscle Strain A muscle strain (pulled muscle) happens when a muscle is stretched beyond normal length. It happens when a sudden, violent force stretches your muscle too far. Usually, a few of the fibers in your muscle are torn. Muscle strain is common in athletes. Recovery usually takes 1-2 weeks. Complete healing takes 5-6 weeks.  HOME CARE   Follow the PRICE method of treatment to help your injury get better. Do this the first 2-3 days after the injury:  Protect. Protect the muscle to keep it from getting injured again.  Rest. Limit your activity and rest the injured body part.  Ice. Put ice in a plastic bag. Place a towel between your skin and the bag. Then, apply the ice and leave it on from 15-20 minutes each hour. After the third day, switch to moist heat packs.  Compression. Use a splint or elastic bandage on the injured area for comfort. Do not put it on too tightly.  Elevate. Keep the injured body part above the level of your heart.  Only take medicine as told by your doctor.  Warm up before doing exercise to prevent future muscle strains. GET HELP IF:   You have more pain or puffiness (swelling) in the injured area.  You feel numbness, tingling, or notice a loss of strength in the injured area. MAKE SURE YOU:   Understand these instructions.  Will watch your condition.  Will get help right away if you are not doing well or get worse.   This information is not intended to replace advice given to you by your health care provider. Make sure you discuss any questions you  have with your health care provider.   Document Released: 05/07/2008 Document Revised: 05/19/2013 Document Reviewed: 02/25/2013 Elsevier Interactive Patient Education [employer]2016 Elsevier Inc.

## 2015-12-18 NOTE — ED Provider Notes (Signed)
CSN: [id number]     Arrival date & time 12/18/15  2025 History   First MD Initiated Contact with Patient 12/18/15 2211     Chief Complaint  Patient presents with  . Leg Pain     (Consider location/radiation/quality/duration/timing/severity/associated sxs/prior Treatment) HPI Comments: Patient is a 25 year old male who presents to the emergency department with complaint of right calf pain.  The patient states he was playing soccer earlier today. After one of his soccer moves, he felt 3 pops in his right calf. He states that he was unable to apply weight to the area following the 3 pops. He has pain, particularly when he flexes or extends his ankle or his knee. He has not had any previous operations or procedures involving the right lower extremity.  Patient is a 25 y.o. male presenting with leg pain. The history is provided by the patient.  Leg Pain Associated symptoms: no back pain and no neck pain     Past Medical History  Diagnosis Date  . Asthma    Past Surgical History  Procedure Laterality Date  . Tonsillectomy    . Surgery of lip      age 25 or 1920   Family History  Problem Relation Age of Onset  . Cancer Other     breast   Social History  Substance Use Topics  . Smoking status: Never Smoker   . Smokeless tobacco: None  . Alcohol Use: Yes     Comment: occasionally    Review of Systems  Constitutional: Negative for activity change.       All ROS Neg except as noted in HPI  HENT: Negative for nosebleeds.   Eyes: Negative for photophobia and discharge.  Respiratory: Negative for cough, shortness of breath and wheezing.   Cardiovascular: Negative for chest pain and palpitations.  Gastrointestinal: Negative for abdominal pain and blood in stool.  Genitourinary: Negative for dysuria, frequency and hematuria.  Musculoskeletal: Negative for back pain, arthralgias and neck pain.  Skin: Negative.   Neurological: Negative for dizziness, seizures and speech difficulty.    Psychiatric/Behavioral: Negative for hallucinations and confusion.      Allergies  Amoxil and Penicillins  Home Medications   Prior to Admission medications   Medication Sig Start Date End Date Taking? Authorizing Provider  albuterol (VENTOLIN HFA) 108 (90 Base) MCG/ACT inhaler INHALE 2 PUFFS BY MOUTH EVERY 6 HOURS AS NEEDED FOR WHEEZING 09/06/15  Yes Babs SciaraScott A Luking, MD  fexofenadine (ALLEGRA) 180 MG tablet Take 180 mg by mouth daily.   Yes Historical Provider, MD  Fluticasone-Salmeterol (ADVAIR DISKUS) 250-50 MCG/DOSE AEPB INHALE 1 PUFF BY MOUTH EVERY 12 HOURS 12/18/15  Yes Scott A Luking, MD   BP 136/68 mmHg  Pulse 98  Temp(Src) 98 F (36.7 C) (Oral)  Resp 20  Ht 5\' 5"  (1.651 m)  Wt 121.564 kg  BMI 44.60 kg/m2  SpO2 98% Physical Exam  Constitutional: He is oriented to person, place, and time. He appears well-developed and well-nourished.  Non-toxic appearance.  HENT:  Head: Normocephalic.  Right Ear: Tympanic membrane and external ear normal.  Left Ear: Tympanic membrane and external ear normal.  Eyes: EOM and lids are normal. Pupils are equal, round, and reactive to light.  Neck: Normal range of motion. Neck supple. Carotid bruit is not present.  Cardiovascular: Normal rate, regular rhythm, normal heart sounds, intact distal pulses and normal pulses.   Pulmonary/Chest: Breath sounds normal. No respiratory distress.  Abdominal: Soft. Bowel sounds are normal. There is  no tenderness. There is no guarding.  Musculoskeletal: Normal range of motion.       Right lower leg: He exhibits tenderness. He exhibits no deformity.       Legs: Patient states he is too uncomfortable to allow me to do a Thompson's test. There does not appear to be any significant swelling of the calf. There is tenderness of the posterior calf area. The Achilles tendon appears to be intact with limited evaluation. There no temperature changes of the right lower extremity.  Lymphadenopathy:       Head (right  side): No submandibular adenopathy present.       Head (left side): No submandibular adenopathy present.    He has no cervical adenopathy.  Neurological: He is alert and oriented to person, place, and time. He has normal strength. No cranial nerve deficit or sensory deficit.  Skin: Skin is warm and dry.  Psychiatric: He has a normal mood and affect. His speech is normal.  Nursing note and vitals reviewed.   ED Course  Procedures (including critical care time) Labs Review Labs Reviewed - No data to display  Imaging Review Dg Tibia/fibula Right  12/18/2015  CLINICAL DATA:  Soccer injury. EXAM: RIGHT TIBIA AND FIBULA - 2 VIEW COMPARISON:  None. FINDINGS: There is no evidence of fracture or other focal bone lesions. Soft tissues are unremarkable. IMPRESSION: Negative. Electronically Signed   By: Signa Kell M.D.   On: 12/18/2015 21:38   I have personally reviewed and evaluated these images and lab results as part of my medical decision-making.   EKG Interpretation None      MDM  My evaluation is limited, as the patient is too uncomfortable to allow me to attempt a Thompson's test. His Achilles tendon appears to be intact. There is no significant swelling of the calf. The examination favors strain/sprain of the calf on the right. The patient is fitted with a knee immobilizer and ankle stirrup splint. He is placed on crutches. He is asked to elevate and rest his leg. He is to see the orthopedic specialist if not improving over the next 5 or 6 days. The patient will be treated with Zanaflex and ibuprofen.    Final diagnoses:  None    **I have reviewed nursing notes, vital signs, and all appropriate lab and imaging results for this patient.Travis Quale, PA-C 12/18/15 2249  Doug Sou, MD 12/19/15 [phone]

## 2015-12-18 NOTE — ED Notes (Signed)
Patient states he was playing soccer this evening and felt a pop in the right calf.

## 2016-07-11 ENCOUNTER — Ambulatory Visit (INDEPENDENT_AMBULATORY_CARE_PROVIDER_SITE_OTHER): Payer: Self-pay

## 2016-07-11 DIAGNOSIS — Z23 Encounter for immunization: Secondary | ICD-10-CM

## 2016-07-20 IMAGING — CR DG CHEST 2V
2 series · 2 of 2 positions shown · non-contrast
Comparison: PA and lateral chest of February 06, 2014

CLINICAL DATA: Productive cough and fever for 4 days

EXAM:
CHEST  2 VIEW

[view not recorded (1 of 2)]
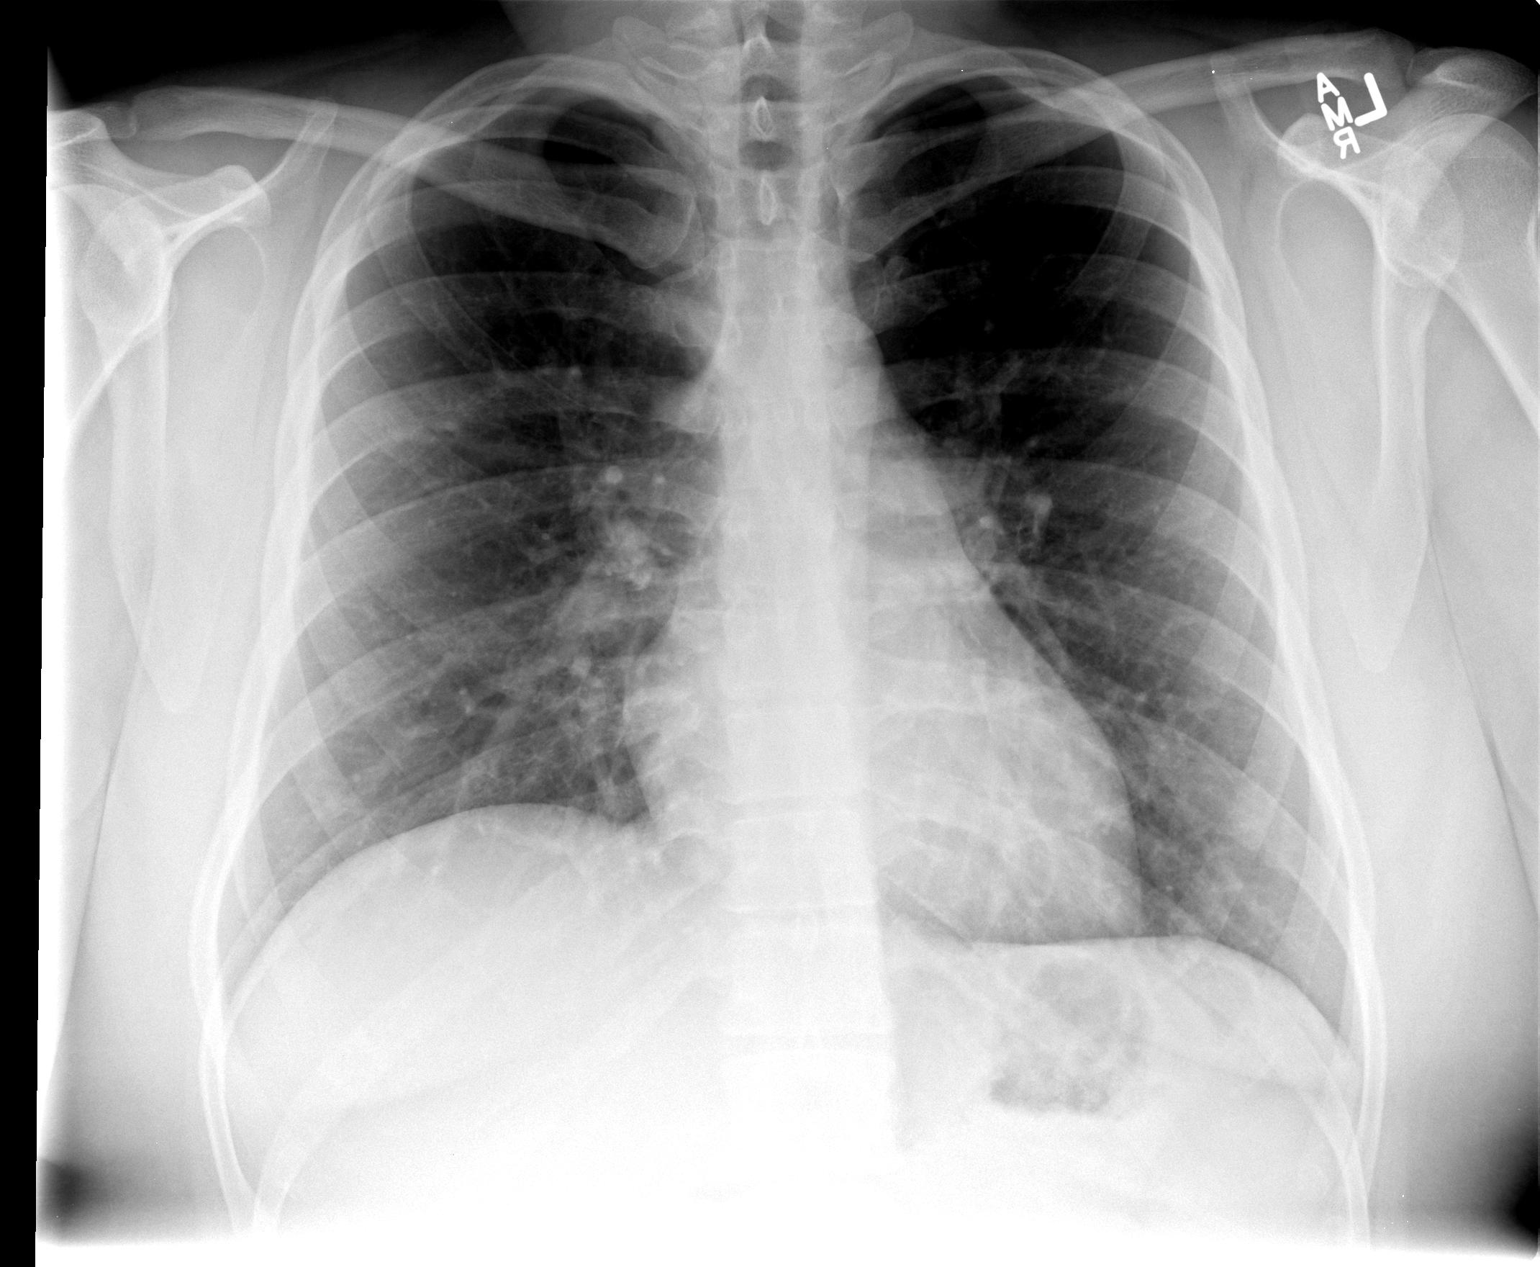

[view not recorded (2 of 2)]
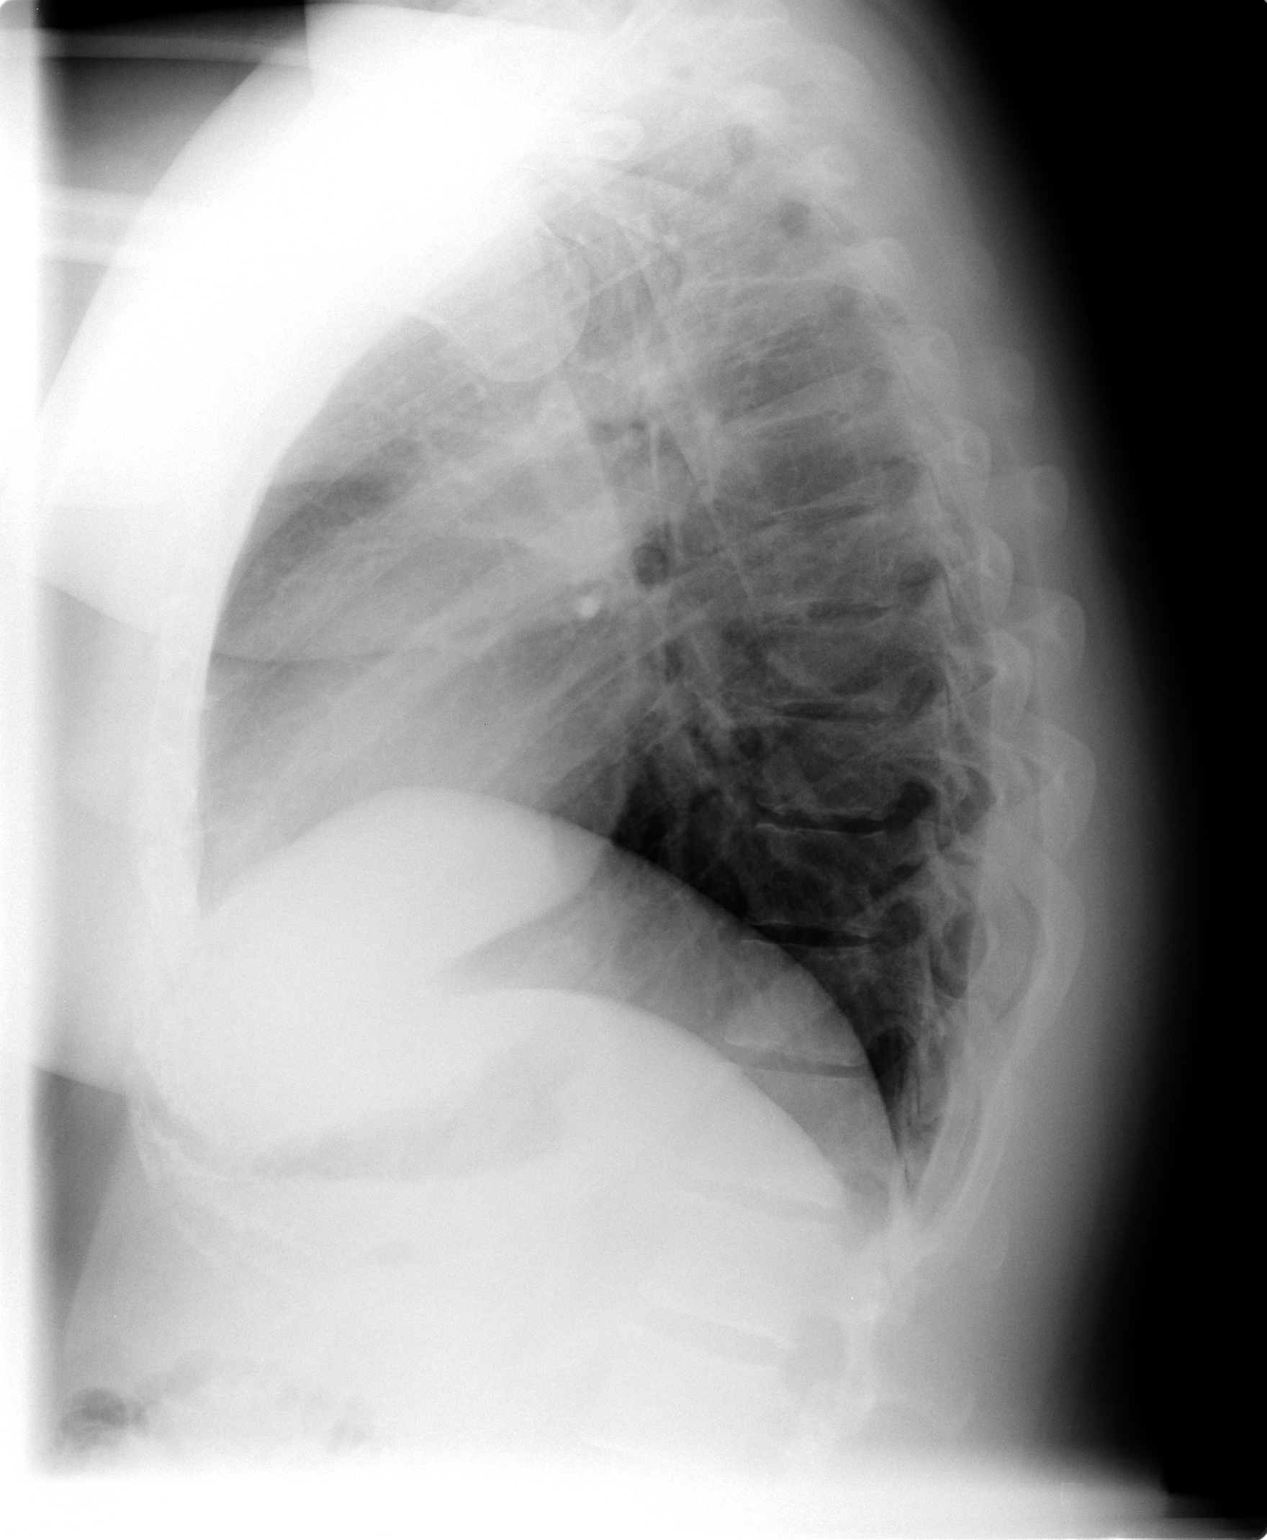

[2 of 2 positions shown; findings below may reference images not displayed]

FINDINGS: The lungs are adequately inflated. There is no focal infiltrate. The
interstitial markings are coarse though stable. The heart and
pulmonary vascularity appear normal. The mediastinum is normal in
width. There is no pleural effusion. The bony thorax is
unremarkable.
IMPRESSION: There is no evidence of pneumonia. One cannot exclude acute
bronchitis in the appropriate clinical setting.

## 2016-09-03 ENCOUNTER — Emergency Department (HOSPITAL_COMMUNITY)
Admission: EM | Admit: 2016-09-03 | Discharge: 2016-09-03 | Disposition: A | Discharge status: Home / Self Care | Payer: BLUE CROSS/BLUE SHIELD | Attending: Emergency Medicine | Admitting: Emergency Medicine

## 2016-09-03 ENCOUNTER — Ambulatory Visit (INDEPENDENT_AMBULATORY_CARE_PROVIDER_SITE_OTHER): Payer: Self-pay | Admitting: Family Medicine

## 2016-09-03 ENCOUNTER — Encounter (HOSPITAL_COMMUNITY): Payer: Self-pay

## 2016-09-03 ENCOUNTER — Encounter: Payer: Self-pay | Admitting: Family Medicine

## 2016-09-03 VITALS — BP 110/72 | Temp 98.3°F | Ht 66.5 in | Wt 263.1 lb

## 2016-09-03 DIAGNOSIS — J019 Acute sinusitis, unspecified: Secondary | ICD-10-CM

## 2016-09-03 DIAGNOSIS — J069 Acute upper respiratory infection, unspecified: Secondary | ICD-10-CM

## 2016-09-03 DIAGNOSIS — J45909 Unspecified asthma, uncomplicated: Secondary | ICD-10-CM | POA: Insufficient documentation

## 2016-09-03 MED ORDER — HYDROCODONE-HOMATROPINE 5-1.5 MG/5ML PO SYRP: 5.0000 mL | ORAL_SOLUTION | Freq: Four times a day (QID) | ORAL | 0 refills | Status: DC | PRN

## 2016-09-03 MED ORDER — CEPHALEXIN 500 MG PO CAPS: 500.0000 mg | ORAL_CAPSULE | Freq: Three times a day (TID) | ORAL | 0 refills | Status: DC

## 2016-09-03 NOTE — Progress Notes (Signed)
   Subjective:    Patient ID: Shellia CarwinSteven Stauch, male    DOB: 1991/07/03, 26 y.o.   MRN: [id number]  URI   This is a new problem. The current episode started in the past 7 days. The problem has been unchanged. Associated symptoms include congestion, coughing, headaches, a sore throat and vomiting. Associated symptoms comments: Body aches, fever. He has tried acetaminophen (nyquil, mucinex) for the symptoms. The treatment provided no relief.   Pos fever and chills  Pos productive   Headache  Ear pain left ear  vom with the coughing   Pos prod coouh lots of mucus      Review of Systems  HENT: Positive for congestion and sore throat.   Respiratory: Positive for cough.   Gastrointestinal: Positive for vomiting.  Neurological: Positive for headaches.       Objective:   Physical Exam  Alert moderate malaise. Significant cough congestion. Positive nasal discharge positive frontal tenderness left ear effusion lungs clear heart regular in rhythm.      Assessment & Plan:  Impression 1 post viral rhinosinusitis/otitis plan antibiotics prescribed symptom care discussed warning signs discussed

## 2016-09-03 NOTE — ED Triage Notes (Addendum)
Pt with complaints of HA, left ear pain, nasal congestion and body aches since last Thursday. Vomited x 2 this am.. Has hx of asthma and has been using nebulizer

## 2016-09-03 NOTE — ED Notes (Signed)
ED Provider at bedside. 

## 2016-09-03 NOTE — ED Provider Notes (Signed)
[facility] DEPT Provider Note   CSN: [id number] Arrival date & time: 09/03/16  0910     History   Chief Complaint Chief Complaint  Patient presents with  . Otalgia  . Generalized Body Aches    HPI Shellia CarwinSteven Hatchell is a 26 y.o. male  Presenting with 5 days of cough, runny nose, sore throat, subjective fever, chills, left ear pain, and one time vomiting today. He has tried Mucinex with a little relief, and Tylenol.  He does have a history of ill contacts with his son sick last week. denies diarrhea,  Dysuria, hematuria, shortness of breath or chest pain. He has uses nebulizing treatment at home 2 days ago.  Has not had to use it today.  HPI  Past Medical History:  Diagnosis Date  . Asthma     Patient Active Problem List   Diagnosis Date Noted  . Pars defect of lumbar spine 08/18/2014  . Asthma, mild intermittent 06/24/2013    Past Surgical History:  Procedure Laterality Date  . SURGERY OF LIP     age 26 or 7019  . TONSILLECTOMY         Home Medications    Prior to Admission medications   Medication Sig Start Date End Date Taking? Authorizing Provider  albuterol (VENTOLIN HFA) 108 (90 Base) MCG/ACT inhaler INHALE 2 PUFFS BY MOUTH EVERY 6 HOURS AS NEEDED FOR WHEEZING 09/06/15   Babs SciaraScott A Luking, MD  fexofenadine (ALLEGRA) 180 MG tablet Take 180 mg by mouth daily.    Historical Provider, MD  Fluticasone-Salmeterol (ADVAIR DISKUS) 250-50 MCG/DOSE AEPB INHALE 1 PUFF BY MOUTH EVERY 12 HOURS 12/18/15   Babs SciaraScott A Luking, MD  ibuprofen (ADVIL,MOTRIN) 800 MG tablet Take 1 tablet (800 mg total) by mouth every 8 (eight) hours as needed. 12/18/15   Ivery QualeHobson Bryant, PA-C  tiZANidine (ZANAFLEX) 4 MG capsule Take 1 capsule (4 mg total) by mouth 3 (three) times daily. 12/18/15   Ivery QualeHobson Bryant, PA-C    Family History Family History  Problem Relation Age of Onset  . Cancer Other     breast    Social History Social History  Substance Use Topics  . Smoking status: Never Smoker  . Smokeless  tobacco: Never Used  . Alcohol use Yes     Comment: occasionally     Allergies   Amoxil [amoxicillin] and Penicillins   Review of Systems Review of Systems  Constitutional: Positive for chills and fever.       Patient reports subjective fevers and chills  HENT: Positive for congestion, ear pain, rhinorrhea and sore throat.   Eyes: Negative for pain and visual disturbance.  Respiratory: Positive for cough. Negative for chest tightness, shortness of breath, wheezing and stridor.   Cardiovascular: Negative for chest pain, palpitations and leg swelling.  Gastrointestinal: Positive for vomiting. Negative for abdominal distention, abdominal pain, blood in stool, diarrhea and nausea.       Patient states that he vomited once today but is not nauseated at this time.  Genitourinary: Negative for dysuria, flank pain and hematuria.  Musculoskeletal: Positive for myalgias. Negative for arthralgias, back pain, neck pain and neck stiffness.  Skin: Negative for color change and rash.  Neurological: Negative for seizures and syncope.  All other systems reviewed and are negative.    Physical Exam Updated Vital Signs BP 136/66 (BP Location: Left Arm)   Pulse 90   Temp 98.5 F (36.9 C) (Oral)   Resp 18   Ht 5\' 6"  (1.676 m)  Wt 119.8 kg   SpO2 100%   BMI 42.63 kg/m   Physical Exam  Constitutional: He appears well-developed and well-nourished. No distress.  Afebrile, nontoxic-appearing, sitting comfortably in bed in no acute distress.  HENT:  Head: Normocephalic and atraumatic.  Right Ear: External ear normal.  Left Ear: External ear normal.  Mouth/Throat: Oropharynx is clear and moist. No oropharyngeal exudate.  Left ear canal slightly erythematous no bulging of the tympanic membrane.  Eyes: Conjunctivae and EOM are normal. Pupils are equal, round, and reactive to light. Right eye exhibits no discharge. Left eye exhibits no discharge.  Neck: Normal range of motion. Neck supple.    Cardiovascular: Normal rate, regular rhythm and normal heart sounds.   No murmur heard. Pulmonary/Chest: Effort normal and breath sounds normal. No respiratory distress. He has no wheezes. He has no rales. He exhibits no tenderness.  Abdominal: Soft. He exhibits no distension and no mass. There is no tenderness. There is no rebound and no guarding.  Musculoskeletal: He exhibits no edema.  Lymphadenopathy:    He has no cervical adenopathy.  Neurological: He is alert.  Skin: Skin is warm and dry. No rash noted. He is not diaphoretic. No pallor.  Psychiatric: He has a normal mood and affect.  Nursing note and vitals reviewed.    ED Treatments / Results  Labs (all labs ordered are listed, but only abnormal results are displayed) Labs Reviewed - No data to display  EKG  EKG Interpretation None       Radiology No results found.  Procedures Procedures (including critical care time)  Medications Ordered in ED Medications - No data to display   Initial Impression / Assessment and Plan / ED Course  I have reviewed the triage vital signs and the nursing notes.  Pertinent labs & imaging results that were available during my care of the patient were reviewed by me and considered in my medical decision making (see chart for details).     Otherwise healthy 26 year old male presenting with 5 days of upper respiratory symptoms and vomiting once today.  Exam is unremarkable. Lungs are clear and equal bilaterally. Patient is afebrile, nontoxic appearing and in no acute distress. He reports a history of ill contacts with his son being sick last week.  Discharge home with symptomatic relief and close follow up with PCP. Patient states that he has an appointment with his PCP later today. Advised patient to drink plenty of fluids and use his nebulizing treatment as needed. Patient refused zofran at this time. He states that he only vomited once and felt fine.  Discussed strict return  precautions. Patient was advised to return to the emergency department if experiencing any worsening of symptoms.  Patient understood instructions and agreed with discharge plan.   Final Clinical Impressions(s) / ED Diagnoses   Final diagnoses:  Viral upper respiratory tract infection    New Prescriptions Discharge Medication List as of 09/03/2016 11:57 AM       Georgiana Shore, PA-C 09/03/16 2113    Pricilla Loveless, MD 09/06/16 2308

## 2016-09-12 ENCOUNTER — Ambulatory Visit (INDEPENDENT_AMBULATORY_CARE_PROVIDER_SITE_OTHER): Payer: Self-pay | Admitting: Family Medicine

## 2016-09-12 ENCOUNTER — Encounter: Payer: Self-pay | Admitting: Family Medicine

## 2016-09-12 VITALS — BP 124/80 | Temp 97.8°F | Ht 66.5 in | Wt 263.0 lb

## 2016-09-12 DIAGNOSIS — A084 Viral intestinal infection, unspecified: Secondary | ICD-10-CM

## 2016-09-12 DIAGNOSIS — J019 Acute sinusitis, unspecified: Secondary | ICD-10-CM

## 2016-09-12 DIAGNOSIS — B9689 Other specified bacterial agents as the cause of diseases classified elsewhere: Secondary | ICD-10-CM

## 2016-09-12 MED ORDER — SULFAMETHOXAZOLE-TRIMETHOPRIM 800-160 MG PO TABS: 1.0000 | ORAL_TABLET | Freq: Two times a day (BID) | ORAL | 0 refills | Status: DC

## 2016-09-12 MED ORDER — HYDROCODONE-HOMATROPINE 5-1.5 MG/5ML PO SYRP: 5.0000 mL | ORAL_SOLUTION | Freq: Four times a day (QID) | ORAL | 0 refills | Status: DC | PRN

## 2016-09-12 MED ORDER — PROMETHAZINE HCL 25 MG PO TABS: 25.0000 mg | ORAL_TABLET | Freq: Four times a day (QID) | ORAL | 0 refills | Status: DC | PRN

## 2016-09-12 NOTE — Progress Notes (Signed)
   Subjective:    Patient ID: Travis Aguilar, male    DOB: [birth year], 26 y.o.   MRN: [id number]  Sinusitis  This is a new problem. Episode onset: 2 weeks. Associated symptoms include congestion, coughing, headaches and a sore throat. (Fever, abdominal pain, vomiting, diarrhea) Treatments tried: hycodan, keflex.   Very sig vomiting last few days  soe sweaty no sig feer  Diarrhea pretty bad  Cramping in the stomach intermittently   Review of Systems  HENT: Positive for congestion and sore throat.   Respiratory: Positive for cough.   Neurological: Positive for headaches.       Objective:   Physical Exam  Alert, mild malaise. Hydration good Vitals stable. frontal/ maxillary tenderness evident positive nasal congestion. pharynx normal neck supple  lungs clear/no crackles or wheezes. heart regular in rhythm       Assessment & Plan:  Impression rhinosinusitisWith gastroenteritis symptoms also likely post viral, discussed with patient. plan antibiotics prescribed. Questions answered. Symptomatic care discussed. warning signs discussed. Phenergan when necessary WSL

## 2016-10-30 ENCOUNTER — Ambulatory Visit (INDEPENDENT_AMBULATORY_CARE_PROVIDER_SITE_OTHER): Payer: BLUE CROSS/BLUE SHIELD | Admitting: Family Medicine

## 2016-10-30 ENCOUNTER — Encounter: Payer: Self-pay | Admitting: Family Medicine

## 2016-10-30 VITALS — BP 112/72 | Ht 66.5 in | Wt 265.0 lb

## 2016-10-30 DIAGNOSIS — J4540 Moderate persistent asthma, uncomplicated: Secondary | ICD-10-CM

## 2016-10-30 MED ORDER — FLUTICASONE-SALMETEROL 250-50 MCG/DOSE IN AEPB: INHALATION_SPRAY | RESPIRATORY_TRACT | 11 refills | Status: DC

## 2016-10-30 MED ORDER — ALBUTEROL SULFATE HFA 108 (90 BASE) MCG/ACT IN AERS: INHALATION_SPRAY | RESPIRATORY_TRACT | 11 refills | Status: DC

## 2016-10-30 MED ORDER — ALBUTEROL SULFATE (2.5 MG/3ML) 0.083% IN NEBU: 2.5000 mg | INHALATION_SOLUTION | Freq: Four times a day (QID) | RESPIRATORY_TRACT | 11 refills | Status: DC | PRN

## 2016-10-30 NOTE — Progress Notes (Signed)
   Subjective:    Patient ID: Travis Aguilar, male    DOB: 1991-03-23, 26 y.o.   MRN: [id number]  HPICheck up on Asthma. No concerns. Needs refills on albuterol inhaler, albuterol neb solution and advair.   Uses  advair faithfully  farely uses rescue inhaler  Without the use of Advair patient has substantial challenges with wheezing and near daily use of rescue inhaler Uses neb prn    Review of Systems No headache, no major weight loss or weight gain, no chest pain no back pain abdominal pain no change in bowel habits complete ROS otherwise negative     Objective:   Physical Exam Alert vitals stable, NAD. Blood pressure good on repeat. HEENT normal. Lungs clear. Heart regular rate and rhythm.        Assessment & Plan:  Impression moderate persistent asthma plan maintain Advair diet exercise discussed albuterol when necessary encouraged yearly flu shots

## 2016-11-14 DIAGNOSIS — H5213 Myopia, bilateral: Secondary | ICD-10-CM | POA: Diagnosis not present

## 2016-11-14 DIAGNOSIS — H52222 Regular astigmatism, left eye: Secondary | ICD-10-CM | POA: Diagnosis not present

## 2017-05-21 ENCOUNTER — Encounter: Payer: Self-pay | Admitting: Family Medicine

## 2017-05-21 ENCOUNTER — Encounter: Payer: Self-pay | Admitting: Nurse Practitioner

## 2017-05-21 ENCOUNTER — Ambulatory Visit (INDEPENDENT_AMBULATORY_CARE_PROVIDER_SITE_OTHER): Payer: BLUE CROSS/BLUE SHIELD | Admitting: Nurse Practitioner

## 2017-05-21 VITALS — Temp 98.2°F | Ht 66.5 in | Wt 265.0 lb

## 2017-05-21 DIAGNOSIS — B9689 Other specified bacterial agents as the cause of diseases classified elsewhere: Secondary | ICD-10-CM | POA: Diagnosis not present

## 2017-05-21 DIAGNOSIS — J4540 Moderate persistent asthma, uncomplicated: Secondary | ICD-10-CM

## 2017-05-21 DIAGNOSIS — J069 Acute upper respiratory infection, unspecified: Secondary | ICD-10-CM | POA: Diagnosis not present

## 2017-05-21 MED ORDER — PREDNISONE 20 MG PO TABS: ORAL_TABLET | ORAL | 0 refills | Status: DC

## 2017-05-21 MED ORDER — AZITHROMYCIN 250 MG PO TABS: ORAL_TABLET | ORAL | 0 refills | Status: DC

## 2017-05-21 NOTE — Progress Notes (Signed)
Subjective:  Presents for complaints of cough wheezing and congestion over the past week. Has run a fever at times. Sore throat. Generalized headache. Runny nose. Mild right ear pain. Frequent cough. Producing dark yellow mucus at times. Has had a flareup of his asthma, using his inhaler frequently over the past few days; has used his inhaler multiple times this morning, last time before office visit. Only has asthma flareups when he is sick. Had diarrhea multiple times couple of days ago, 1-2 times yesterday and none today. No vomiting. No abdominal pain. Taking fluids well. Voiding normal limit.  Objective:   Temp 98.2 F (36.8 C) (Oral)   Ht 5' 6.5" (1.689 m)   Wt 265 lb (120.2 kg)   BMI 42.13 kg/m  NAD. Alert, oriented. TMs clear effusion, no erythema. Pharynx posterior erythematous with green PND noted. Neck supple with mild soft anterior adenopathy. Lungs clear. No wheezing or tachypnea. Rare nonproductive cough. Heart regular rate rhythm.  Assessment:   Problem List Items Addressed This Visit      Respiratory   Asthma, moderate persistent - Primary   Relevant Medications   predniSONE (DELTASONE) 20 MG tablet    Other Visit Diagnoses    Bacterial upper respiratory infection       Relevant Medications   azithromycin (ZITHROMAX Z-PAK) 250 MG tablet       Plan:   Meds ordered this encounter  Medications  . azithromycin (ZITHROMAX Z-PAK) 250 MG tablet    Sig: Take 2 tablets (500 mg) on  Day 1,  followed by 1 tablet (250 mg) once daily on Days 2 through 5.    Dispense:  6 each    Refill:  0    Order Specific Question:   Supervising Provider    Answer:   Merlyn Albert [[id number]]  . predniSONE (DELTASONE) 20 MG tablet    Sig: 3 po qd x 3 d then 2 po qd x 3 d then 1 po qd x 2 d    Dispense:  17 tablet    Refill:  0    Order Specific Question:   Supervising Provider    Answer:   Merlyn Albert [[id number]]   OTC meds as directed for cough and congestion. Warning signs reviewed.  Call back if symptoms worsen or persist. Follow-up in 2 weeks for flu vaccine.

## 2017-06-03 ENCOUNTER — Ambulatory Visit (INDEPENDENT_AMBULATORY_CARE_PROVIDER_SITE_OTHER): Payer: BLUE CROSS/BLUE SHIELD

## 2017-06-03 DIAGNOSIS — Z23 Encounter for immunization: Secondary | ICD-10-CM

## 2017-06-17 ENCOUNTER — Ambulatory Visit (INDEPENDENT_AMBULATORY_CARE_PROVIDER_SITE_OTHER): Payer: BLUE CROSS/BLUE SHIELD | Admitting: Family Medicine

## 2017-06-17 ENCOUNTER — Encounter: Payer: Self-pay | Admitting: Family Medicine

## 2017-06-17 VITALS — BP 140/80 | Temp 97.9°F | Wt 274.0 lb

## 2017-06-17 DIAGNOSIS — J019 Acute sinusitis, unspecified: Secondary | ICD-10-CM

## 2017-06-17 DIAGNOSIS — B9689 Other specified bacterial agents as the cause of diseases classified elsewhere: Secondary | ICD-10-CM | POA: Diagnosis not present

## 2017-06-17 MED ORDER — PREDNISONE 20 MG PO TABS: ORAL_TABLET | ORAL | 0 refills | Status: DC

## 2017-06-17 MED ORDER — CLARITHROMYCIN 500 MG PO TABS: 500.0000 mg | ORAL_TABLET | Freq: Two times a day (BID) | ORAL | 0 refills | Status: DC

## 2017-06-17 NOTE — Progress Notes (Signed)
Subjective:     Patient ID: Travis Aguilar, male   DOB: 08-15-1990, 26 y.o.   MRN: [id number]  Cough  This is a new problem. The current episode started in the past 7 days. Associated symptoms include ear pain, headaches, rhinorrhea, a sore throat and wheezing. Treatments tried: Advil, nebulizer     Patient states no other concerns this visit.   Cough and cong and headache  Frontal sharp and achey  Sore throat intermitently    Review of Systems  HENT: Positive for ear pain, rhinorrhea and sore throat.   Respiratory: Positive for cough and wheezing.   Neurological: Positive for headaches.       Objective:   Physical Exam Alert, mild malaise. Hydration good Vitals stable. frontal/ maxillary tenderness evident positive nasal congestion. pharynx normal neck supple  lungs clear/no crackles plus moderate wheezes. heart regular in rhythm     Assessment:     Impression rhinosinusitis exacerbation of reactive airways albuterol and steroids prescribed likely post viral, discussed with patient. plan antibiotics prescribed. Questions answered. Symptomatic care discussed. warning signs discussed. WSL     Plan:

## 2017-07-04 ENCOUNTER — Telehealth: Payer: Self-pay | Admitting: Family Medicine

## 2017-07-04 ENCOUNTER — Ambulatory Visit: Payer: BLUE CROSS/BLUE SHIELD | Admitting: Family Medicine

## 2017-07-04 VITALS — BP 122/68 | Temp 98.1°F | Wt 273.1 lb

## 2017-07-04 DIAGNOSIS — J4521 Mild intermittent asthma with (acute) exacerbation: Secondary | ICD-10-CM

## 2017-07-04 DIAGNOSIS — J019 Acute sinusitis, unspecified: Secondary | ICD-10-CM

## 2017-07-04 MED ORDER — PREDNISONE 20 MG PO TABS: ORAL_TABLET | ORAL | 0 refills | Status: DC

## 2017-07-04 MED ORDER — AZITHROMYCIN 250 MG PO TABS: ORAL_TABLET | ORAL | 0 refills | Status: DC

## 2017-07-04 NOTE — Addendum Note (Signed)
Addended by: Lilyan PuntLUKING, Alexys Lobello A on: 07/04/2017 02:51 PM   Modules accepted: Orders

## 2017-07-04 NOTE — Telephone Encounter (Signed)
Very unusual-I had did a prednisone taper and Zithromax while the patient was with me but I see no record of it in epic.  I resent it to [facility] just now

## 2017-07-04 NOTE — Telephone Encounter (Signed)
[facility] did not receive Rx for pt's antibiotics (pt went to pick up his Rx & they do not have one for him)  Please advise & call pt when done

## 2017-07-04 NOTE — Progress Notes (Signed)
   Subjective:    Patient ID: Travis CarwinSteven Aguilar, male    DOB: September 22, 1990, 26 y.o.   MRN: [id number]  Cough  This is a new problem. The current episode started in the past 7 days. Associated symptoms include ear pain, a fever, headaches, nasal congestion, rhinorrhea, a sore throat and wheezing. Pertinent negatives include no chest pain.   Viral-like illness several days then head congestion drainage sinus pressure coughing now with wheezing no difficulty breathing but does get a little short winded if he pushes himself too much PMH reactive airway   Review of Systems  Constitutional: Positive for fever. Negative for activity change.  HENT: Positive for congestion, ear pain, rhinorrhea and sore throat.   Eyes: Negative for discharge.  Respiratory: Positive for cough and wheezing.   Cardiovascular: Negative for chest pain.  Neurological: Positive for headaches.       Objective:   Physical Exam  Constitutional: He appears well-developed.  HENT:  Head: Normocephalic.  Mouth/Throat: Oropharynx is clear and moist. No oropharyngeal exudate.  Neck: Normal range of motion.  Cardiovascular: Normal rate, regular rhythm and normal heart sounds.  No murmur heard. Pulmonary/Chest: Effort normal. He has wheezes.  Lymphadenopathy:    He has no cervical adenopathy.  Neurological: He exhibits normal muscle tone.  Skin: Skin is warm and dry.  Nursing note and vitals reviewed.         Assessment & Plan:  Viral syndrome Flareup of reactive airway Acute rhinosinusitis Prednisone taper Antibiotics prescribed Follow-up if progressive troubles or if worse warning signs discussed in detail

## 2017-07-04 NOTE — Telephone Encounter (Signed)
No antibiotics were sent in. Please advise

## 2017-07-04 NOTE — Telephone Encounter (Signed)
Spoke with patient and informed him per Dr.Scott Luking- Antibiotics were sent in. Patient verbalized understanding.

## 2017-08-01 ENCOUNTER — Ambulatory Visit (INDEPENDENT_AMBULATORY_CARE_PROVIDER_SITE_OTHER): Payer: BLUE CROSS/BLUE SHIELD | Admitting: Family Medicine

## 2017-08-01 ENCOUNTER — Encounter: Payer: Self-pay | Admitting: Family Medicine

## 2017-08-01 VITALS — BP 132/84 | Temp 98.0°F | Ht 66.5 in | Wt 279.0 lb

## 2017-08-01 DIAGNOSIS — M940 Chondrocostal junction syndrome [Tietze]: Secondary | ICD-10-CM | POA: Diagnosis not present

## 2017-08-01 MED ORDER — ETODOLAC 400 MG PO TABS: 400.0000 mg | ORAL_TABLET | Freq: Two times a day (BID) | ORAL | 0 refills | Status: DC

## 2017-08-01 NOTE — Progress Notes (Signed)
   Subjective:    Patient ID: Shellia CarwinSteven France, male    DOB: [birth year], 26 y.o.   MRN: [id number]  Abdominal Pain  This is a new problem. Episode onset: 2 months. The pain is located in the LUQ. The quality of the pain is cramping.    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       Objective:   Physical Exam  Alert vitals stable, NAD. Blood pressure good on repeat. HEENT normal. Lungs clear. Heart regular rate and rhythm. Abdominal exam benign left costal margin tender to palpation distinctly      Assessment & Plan:  Impression costochondritis plan anti-inflammatory medicine prescribed symptom care discussed reassured not some anything serious, though warning signs discussed

## 2017-10-11 DIAGNOSIS — J45909 Unspecified asthma, uncomplicated: Secondary | ICD-10-CM | POA: Diagnosis not present

## 2017-10-11 DIAGNOSIS — R1011 Right upper quadrant pain: Secondary | ICD-10-CM | POA: Diagnosis not present

## 2017-10-11 DIAGNOSIS — Z7951 Long term (current) use of inhaled steroids: Secondary | ICD-10-CM | POA: Diagnosis not present

## 2017-10-11 DIAGNOSIS — Z79899 Other long term (current) drug therapy: Secondary | ICD-10-CM | POA: Diagnosis not present

## 2017-10-12 DIAGNOSIS — R1011 Right upper quadrant pain: Secondary | ICD-10-CM | POA: Diagnosis not present

## 2017-11-27 ENCOUNTER — Encounter: Payer: Self-pay | Admitting: Family Medicine

## 2017-11-27 ENCOUNTER — Ambulatory Visit: Payer: BLUE CROSS/BLUE SHIELD | Admitting: Family Medicine

## 2017-11-27 VITALS — BP 134/86 | Temp 98.5°F | Ht 66.5 in | Wt 277.0 lb

## 2017-11-27 DIAGNOSIS — J0110 Acute frontal sinusitis, unspecified: Secondary | ICD-10-CM

## 2017-11-27 DIAGNOSIS — R6889 Other general symptoms and signs: Secondary | ICD-10-CM | POA: Diagnosis not present

## 2017-11-27 MED ORDER — AZITHROMYCIN 250 MG PO TABS: ORAL_TABLET | ORAL | 0 refills | Status: DC

## 2017-11-27 MED ORDER — PREDNISONE 20 MG PO TABS: ORAL_TABLET | ORAL | 0 refills | Status: DC

## 2017-11-27 NOTE — Progress Notes (Signed)
   Subjective:    Patient ID: Travis Aguilar, male    DOB: 02-Feb-1991, 27 y.o.   MRN: [id number]  Influenza  This is a new problem. The current episode started in the past 7 days. Associated symptoms include coughing, a fever, headaches, a sore throat and vomiting. Associated symptoms comments: WHEEZING, RUNNY NOSE, EAR PAIN. He has tried acetaminophen (BREATHING MACHINE) for the symptoms.   The patient states that he has had significant amount of head congestion drainage coughing chest congestion intermittent wheezing denies high fever states it started off like a flulike illness over the weekend body aches headache coughing fever and then is progressed more into sinus pressure and drainage and coughing the fever is gone away   Review of Systems  Constitutional: Positive for fever.  HENT: Positive for sore throat.   Respiratory: Positive for cough.   Gastrointestinal: Positive for vomiting.  Neurological: Positive for headaches.       Objective:   Physical Exam Eardrums are normal neck no masses not respiratory distress cough is noted no crackles no rails no wheezing  Mild sinus tenderness in the frontal sinuses     Assessment & Plan:  Post flulike illness Will take several days to get back energy level Antibiotics prescribed Short course prednisone Albuterol as needed If progressive troubles or worse follow-up  Warning signs discussed.

## 2018-04-15 ENCOUNTER — Encounter: Payer: Self-pay | Admitting: Family Medicine

## 2018-04-15 ENCOUNTER — Ambulatory Visit: Payer: BLUE CROSS/BLUE SHIELD | Admitting: Family Medicine

## 2018-04-15 VITALS — Temp 98.2°F | Ht 66.5 in | Wt 273.6 lb

## 2018-04-15 DIAGNOSIS — J019 Acute sinusitis, unspecified: Secondary | ICD-10-CM | POA: Diagnosis not present

## 2018-04-15 MED ORDER — CLARITHROMYCIN 500 MG PO TABS: 500.0000 mg | ORAL_TABLET | Freq: Two times a day (BID) | ORAL | 0 refills | Status: DC

## 2018-04-15 NOTE — Progress Notes (Signed)
   Subjective:    Patient ID: Travis Aguilar, male    DOB: Dec 28, 1990, 27 y.o.   MRN: [id number]  Fever   This is a new problem. The current episode started in the past 7 days. Associated symptoms include congestion, coughing, ear pain, headaches, a sore throat and wheezing.   Hit har five d ago  Used mucinex prn not much   Pos fever  trhoat very painfu   Left mid ear pain   HA frontal worse withh cough   Pos prod gnky disc   No smoke         Review of Systems  Constitutional: Positive for fever.  HENT: Positive for congestion, ear pain and sore throat.   Respiratory: Positive for cough and wheezing.   Neurological: Positive for headaches.       Objective:   Physical Exam  Alert, mild malaise. Hydration good Vitals stable. frontal/ maxillary tenderness evident positive nasal congestion. pharynx normal neck supple  lungs clear/no crackles or wheezes. heart regular in rhythm       Assessment & Plan:  Impression rhinosinusitis likely post viral, discussed with patient. plan antibiotics prescribed. Questions answered. Symptomatic care discussed. warning signs discussed. WSL

## 2018-06-01 ENCOUNTER — Ambulatory Visit (INDEPENDENT_AMBULATORY_CARE_PROVIDER_SITE_OTHER): Payer: BLUE CROSS/BLUE SHIELD | Admitting: Family Medicine

## 2018-06-01 ENCOUNTER — Encounter: Payer: Self-pay | Admitting: Family Medicine

## 2018-06-01 VITALS — Temp 98.1°F | Ht 66.5 in | Wt 270.7 lb

## 2018-06-01 DIAGNOSIS — J4521 Mild intermittent asthma with (acute) exacerbation: Secondary | ICD-10-CM

## 2018-06-01 DIAGNOSIS — J019 Acute sinusitis, unspecified: Secondary | ICD-10-CM

## 2018-06-01 MED ORDER — ALBUTEROL SULFATE HFA 108 (90 BASE) MCG/ACT IN AERS: INHALATION_SPRAY | RESPIRATORY_TRACT | 11 refills | Status: DC

## 2018-06-01 MED ORDER — AZITHROMYCIN 250 MG PO TABS: ORAL_TABLET | ORAL | 0 refills | Status: DC

## 2018-06-01 MED ORDER — PREDNISONE 20 MG PO TABS: ORAL_TABLET | ORAL | 0 refills | Status: DC

## 2018-06-01 NOTE — Progress Notes (Signed)
   Subjective:    Patient ID: Travis Aguilar, male    DOB: [birth year], 27 y.o.   MRN: [id number]  Cough  This is a new problem. The current episode started in the past 7 days. Associated symptoms include a fever, headaches, nasal congestion, rhinorrhea, a sore throat and wheezing. Pertinent negatives include no chest pain, chills or ear pain. Treatments tried: tylenol and albuterol.   There is been a viral sickness going through several members of the patient's family Now the patient is having several days of head congestion drainage coughing wheezing having use the albuterol every 3-4 hours denies high fever chills denies vomiting or diarrhea PMH benign PMH reactive airway   Review of Systems  Constitutional: Positive for fever. Negative for activity change and chills.  HENT: Positive for congestion, rhinorrhea and sore throat. Negative for ear pain.   Eyes: Negative for discharge.  Respiratory: Positive for cough and wheezing.   Cardiovascular: Negative for chest pain.  Gastrointestinal: Negative for nausea and vomiting.  Musculoskeletal: Negative for arthralgias.  Neurological: Positive for headaches.       Objective:   Physical Exam  Constitutional: He appears well-developed.  HENT:  Head: Normocephalic.  Mouth/Throat: Oropharynx is clear and moist. No oropharyngeal exudate.  Neck: Normal range of motion.  Cardiovascular: Normal rate, regular rhythm and normal heart sounds.  No murmur heard. Pulmonary/Chest: Effort normal and breath sounds normal. He has no wheezes.  Lymphadenopathy:    He has no cervical adenopathy.  Neurological: He exhibits normal muscle tone.  Skin: Skin is warm and dry.  Nursing note and vitals reviewed.         Assessment & Plan:  Reactive airway Viral syndrome Secondary rhinosinusitis Z-Pak as directed Short course prednisone Albuterol 2 puffs every 4 hours as needed reactive airway wheezing etc. if ongoing troubles or worse follow-up

## 2018-08-10 ENCOUNTER — Ambulatory Visit (INDEPENDENT_AMBULATORY_CARE_PROVIDER_SITE_OTHER): Payer: BLUE CROSS/BLUE SHIELD | Admitting: Family Medicine

## 2018-08-10 ENCOUNTER — Encounter: Payer: Self-pay | Admitting: Family Medicine

## 2018-08-10 VITALS — Temp 98.3°F | Wt 272.2 lb

## 2018-08-10 DIAGNOSIS — J111 Influenza due to unidentified influenza virus with other respiratory manifestations: Secondary | ICD-10-CM | POA: Diagnosis not present

## 2018-08-10 DIAGNOSIS — J4541 Moderate persistent asthma with (acute) exacerbation: Secondary | ICD-10-CM | POA: Diagnosis not present

## 2018-08-10 MED ORDER — PREDNISONE 20 MG PO TABS: ORAL_TABLET | ORAL | 0 refills | Status: DC

## 2018-08-10 MED ORDER — OSELTAMIVIR PHOSPHATE 75 MG PO CAPS: 75.0000 mg | ORAL_CAPSULE | Freq: Two times a day (BID) | ORAL | 0 refills | Status: AC

## 2018-08-10 NOTE — Progress Notes (Signed)
   Subjective:    Patient ID: Travis CarwinSteven Riviello, male    DOB: [birth year], 27 y.o.   MRN: [id number]  Cough  This is a new problem. The current episode started in the past 7 days. The cough is non-productive. Associated symptoms include ear pain, a fever, headaches, myalgias, rhinorrhea, a sore throat and wheezing. Treatments tried: Mucinex. The treatment provided no relief.   Reports 3 days ago started with congestion, cough non-productive, rhinorrhea, and tactile fever. Left ear pain/popping and sore throat. Also reports h/a and some body aches. Has been taking tylenol and mucinex, not very helpful. Has been having to take nebulizer q 4 h yesterday d/t wheezing. Has not been taking advair inhaler.    Review of Systems  Constitutional: Positive for fever.  HENT: Positive for congestion, ear pain, rhinorrhea and sore throat.   Respiratory: Positive for cough and wheezing.   Gastrointestinal: Negative for diarrhea, nausea and vomiting.  Musculoskeletal: Positive for myalgias.  Neurological: Positive for headaches.       Objective:   Physical Exam Vitals signs and nursing note reviewed.  Constitutional:      General: He is not in acute distress.    Appearance: Normal appearance. He is not toxic-appearing.  HENT:     Head: Normocephalic and atraumatic.     Right Ear: Tympanic membrane normal.     Left Ear: Tympanic membrane normal.     Nose: Congestion present. No nasal tenderness.     Mouth/Throat:     Mouth: Mucous membranes are moist.     Pharynx: Oropharynx is clear.  Eyes:     General:        Right eye: No discharge.        Left eye: No discharge.  Neck:     Musculoskeletal: Neck supple. No neck rigidity.  Cardiovascular:     Rate and Rhythm: Normal rate and regular rhythm.     Heart sounds: Normal heart sounds.  Pulmonary:     Effort: Pulmonary effort is normal. No respiratory distress.     Breath sounds: Normal breath sounds. No wheezing, rhonchi or rales.     Comments:  Cough with element of reactive airway. Lymphadenopathy:     Cervical: No cervical adenopathy.  Skin:    General: Skin is warm and dry.  Neurological:     Mental Status: He is alert and oriented to person, place, and time.           Assessment & Plan:  Influenza  Moderate persistent asthma with exacerbation  Discussed likely flu. Tamiflu given d/t asthma hx though outside of 48 hr window. Prednisone taper prescribed. Continue with albuterol neb prn. Symptomatic care discussed, warning signs discussed. F/u if symptoms worsen or fail to improve.   Dr. Lubertha SouthSteve Luking was consulted on this case and is in agreement with the above treatment plan.

## 2018-08-10 NOTE — Patient Instructions (Signed)
Gripe en los adultos Influenza, Adult A la gripe tambin se la conoce como "influenza". Es una infeccin en los pulmones, la nariz y la garganta (vas respiratorias). La causa un virus. La gripe provoca sntomas que son similares a los de un resfro. Tambin causa fiebre alta y dolores corporales. Se transmite fcilmente de persona a persona (es contagiosa). La mejor manera de prevenir la gripe es aplicndose la vacuna contra la gripe todos los aos. Cules son las causas? La causa de esta afeccin es el virus de la influenza. Puede contraer el virus de las siguientes maneras:  Respirar las gotitas que estn en el aire y que provienen de la tos o el estornudo de una persona que tiene el virus.  Tocar algo que tiene el virus (est contaminado) y luego tocarse la boca, la nariz o los ojos. Qu incrementa el riesgo? Hay ciertas cosas que lo pueden hacer ms propenso a tener gripe. Estas incluyen lo siguiente:  No lavarse las manos con frecuencia.  Tener contacto cercano con muchas personas durante la temporada de resfro y gripe.  Tocarse la boca, los ojos o la nariz sin antes lavarse las manos.  No recibir la vacuna antigripal todos los aos. Puede correr un mayor riesgo de tener gripe, junto con problemas graves como una infeccin pulmonar (neumona), si:  Es mayor de 65 aos de edad.  Est embarazada.  Tiene debilitado el sistema que combate las defensas (sistema inmunitario) debido a una enfermedad o porque toma determinados medicamentos.  Tiene una enfermedad prolongada (crnica), por ejemplo: ? Enfermedad cardaca, renal o pulmonar. ? Diabetes. ? Asma.  Tiene un trastorno heptico.  Tiene mucho sobrepeso (obesidad mrbida).  Tiene anemia. Esta es una afeccin que afecta a los glbulos rojos. Cules son los signos o los sntomas? Los sntomas normalmente comienzan de repente y duran entre 4 y 14 das. Pueden incluir los siguientes:  Fiebre y escalofros.  Dolores de  cabeza, dolores en el cuerpo o dolores musculares.  Dolor de garganta.  Tos.  Secrecin o congestin nasal.  Malestar en el pecho.  No desear comer en las cantidades normales (prdida del apetito).  Debilidad o cansancio (fatiga).  Mareos.  Malestar estomacal (nuseas) o ganas de devolver (vmitos). Cmo se trata? Si la gripe se encuentra de forma temprana, se la puede tratar con medicamentos que pueden ayudar a reducir la gravedad de la enfermedad y reducir su duracin (medicamentos antivirales). Estos pueden administrarse por boca (va oral) o por va (catter) intravenosa. Cuidarse en su hogar puede ayudar a que mejoren los sntomas. El mdico puede sugerirle lo siguiente:  Tomar medicamentos de venta libre.  Beber mucho lquido. La gripe suele desaparecer sola. Si tiene sntomas muy graves u otros problemas, puede recibir tratamiento en un hospital. Siga estas indicaciones en su casa:     Actividad  Descanse todo lo que sea necesario. Duerma lo suficiente.  Qudese en su casa y no concurra al trabajo o a la escuela, como se lo haya indicado el mdico. ? No salga de su casa hasta que no haya tenido fiebre por 24horas sin tomar medicamentos. ? Salga de su casa solo para ir al mdico. Comida y bebida  Tome una SRO (solucin de rehidratacin oral). Es una bebida que se vende en farmacias y tiendas.  Beba suficiente lquido para mantener el pis (la orina) de color amarillo plido.  En la medida en que pueda, beba lquidos claros en pequeas cantidades. Los lquidos transparentes son, por ejemplo: ? Agua. ? Trocitos   de hielo. ? Jugo de frutas con agua agregada (jugo de frutas diluido). ? Bebidas deportivas de bajas caloras.  En la medida en que pueda, consuma alimentos blandos y fciles de digerir en pequeas cantidades. Estos alimentos incluyen: ? Bananas. ? Pur de Praxairmanzana. ? Arroz. ? BJ'sCarnes magras. ? Tostadas. ? Galletas.  No coma ni beba lo  siguiente: ? Lquidos con alto contenido de azcar o cafena. ? Alcohol. ? Alimentos condimentados o con alto contenido de [place]. Indicaciones generales  Baxter Internationalome los medicamentos de venta libre y los recetados solamente como se lo haya indicado el mdico.  Use un humidificador de aire fro para que el aire de su casa est ms hmedo. Esto puede facilitar la respiracin.  Al toser o estornudar, cbrase la boca y la [place]iz.  Lvese las manos con agua y jabn frecuentemente, en especial despus de toser o Engineering geologistestornudar. Use desinfectante para manos con alcohol si no dispone de [place] y [place].  Concurra a todas las visitas de control como se lo haya indicado el mdico. Esto es importante. Cmo se evita?   Colquese la vacuna antigripal todos los [place]. Puede colocarse la vacuna contra la gripe a fines de verano, en otoo o en invierno. Pregntele al mdico cundo debe aplicarse la vacuna contra la gripe.  Evite el contacto con personas que estn enfermas durante el otoo y el invierno (la temporada de resfro y gripe). Comunquese con un mdico si:  Tiene sntomas nuevos.  Tiene los siguientes sntomas: ? Journalist, newspaperDolor en el pecho. ? Materia fecal lquida (diarrea). ? Fiebre.  La tos empeora.  Empieza a tener ms mucosidad.  Tiene Programme researcher, broadcasting/film/videomalestar estomacal.  Vomita. Solicite ayuda inmediatamente si:  Le falta el aire.  Tiene dificultad para respirar.  La piel o las uas se ponen de un color azulado.  Presenta dolor muy intenso o rigidez en el cuello.  Tiene dolor de cabeza repentino.  Le duele la cara o el odo de forma repentina.  No puede comer ni beber sin vomitar. Resumen  La gripe es una infeccin en los pulmones, la nariz y Administratorla garganta. La causa un virus.  Tome los medicamentos de venta libre y los recetados solamente como se lo haya indicado el mdico.  Aplicarse la vacuna contra la gripe todos los aos es la mejor manera de evitar contagiarse la gripe. Esta informacin no tiene  Theme park managercomo fin reemplazar el consejo del mdico. Asegrese de hacerle al mdico cualquier pregunta que tenga. Document Released: 10/25/2008 Document Revised: 03/11/2018 Document Reviewed: 03/11/2018 Elsevier Interactive Patient Education  2019 Elsevier Inc. Influenza, Adult Influenza is also called "the flu." It is an infection in the lungs, nose, and throat (respiratory tract). It is caused by a virus. The flu causes symptoms that are similar to symptoms of a cold. It also causes a high fever and body aches. The flu spreads easily from person to person (is contagious). Getting a flu shot (influenza vaccination) every year is the best way to prevent the flu. What are the causes? This condition is caused by the influenza virus. You can get the virus by:  Breathing in droplets that are in the air from the cough or sneeze of a person who has the virus.  Touching something that has the virus on it (is contaminated) and then touching your mouth, nose, or eyes. What increases the risk? Certain things may make you more likely to get the flu. These include:  Not washing your hands often.  Having close contact with many people  to person (is contagious). Getting a flu shot (influenza vaccination) every year is the best way to prevent the flu.  What are the causes?  This condition is caused by the influenza virus. You can get the virus by:   Breathing in droplets that are in the air from the cough or sneeze of a person who has the virus.   Touching something that has the virus on it (is contaminated) and then touching your mouth, nose, or eyes.  What increases the risk?  Certain things may make you more likely to get the flu. These include:   Not washing your hands often.   Having close contact with many people during cold and flu season.   Touching your mouth, eyes, or nose without first washing your hands.   Not getting a flu shot every year.  You may have a higher risk for the flu, along with serious problems such as a lung infection (pneumonia), if you:   Are older than 65.   Are pregnant.   Have a weakened disease-fighting system (immune system) because of a disease or taking certain medicines.   Have a long-term (chronic) illness, such as:  ? Heart, kidney, or lung disease.  ? Diabetes.  ? Asthma.   Have a liver disorder.   Are very overweight (morbidly obese).   Have anemia. This is a condition that affects your red blood cells.  What are the signs or symptoms?  Symptoms usually begin suddenly and last 4-14 days. They may include:   Fever and chills.   Headaches, body aches, or  muscle aches.   Sore throat.   Cough.   Runny or stuffy (congested) nose.   Chest discomfort.   Not wanting to eat as much as normal (poor appetite).   Weakness or feeling tired (fatigue).   Dizziness.   Feeling sick to your stomach (nauseous) or throwing up (vomiting).  How is this treated?  If the flu is found early, you can be treated with medicine that can help reduce how bad the illness is and how long it lasts (antiviral medicine). This may be given by mouth (orally) or through an IV tube.  Taking care of yourself at home can help your symptoms get better. Your doctor may suggest:   Taking over-the-counter medicines.   Drinking plenty of fluids.  The flu often goes away on its own. If you have very bad symptoms or other problems, you may be treated in a hospital.  Follow these instructions at home:         Activity   Rest as needed. Get plenty of sleep.   Stay home from work or school as told by your doctor.  ? Do not leave home until you do not have a fever for 24 hours without taking medicine.  ? Leave home only to visit your doctor.  Eating and drinking   Take an ORS (oral rehydration solution). This is a drink that is sold at pharmacies and stores.   Drink enough fluid to keep your pee (urine) pale yellow.   Drink clear fluids in small amounts as you are able. Clear fluids include:  ? Water.  ? Ice chips.  ? Fruit juice that has water added (diluted fruit juice).  ? Low-calorie sports drinks.   Eat bland, easy-to-digest foods in small amounts as you are able. These foods include:  ? Bananas.  ? Applesauce.  ? Rice.  ? Lean meats.  ? Toast.  ? Crackers.     have: ? Chest pain. ? Watery poop (diarrhea). ? A fever.  Your cough gets worse.  You start to have more mucus.  You feel sick to your stomach.  You throw up. Get help right away if you:  Have shortness of breath.  Have trouble breathing.  Have skin or nails that turn a bluish color.  Have very bad pain or stiffness in your neck.  Get a sudden headache.  Get sudden pain in your face or ear.  Cannot eat or drink without throwing up. Summary  Influenza ("the flu") is an infection in the lungs, nose, and throat. It is caused by a virus.  Take over-the-counter and prescription medicines only as told by your doctor.  Getting a flu shot every year is the best way to avoid getting the flu. This information is not intended to replace advice given to you by your health care provider. Make sure you discuss any questions you have with your health care provider. Document Released: 05/07/2008 Document Revised: 01/14/2018 Document Reviewed: 01/14/2018 Elsevier Interactive Patient Education  2019 [employer]

## 2018-08-11 ENCOUNTER — Ambulatory Visit: Payer: BLUE CROSS/BLUE SHIELD

## 2018-08-18 ENCOUNTER — Encounter: Payer: Self-pay | Admitting: Family Medicine

## 2018-08-18 ENCOUNTER — Ambulatory Visit (INDEPENDENT_AMBULATORY_CARE_PROVIDER_SITE_OTHER): Payer: BLUE CROSS/BLUE SHIELD | Admitting: Family Medicine

## 2018-08-18 VITALS — BP 144/80 | HR 97 | Temp 98.4°F | Ht 66.5 in | Wt 277.8 lb

## 2018-08-18 DIAGNOSIS — J019 Acute sinusitis, unspecified: Secondary | ICD-10-CM | POA: Diagnosis not present

## 2018-08-18 DIAGNOSIS — R062 Wheezing: Secondary | ICD-10-CM | POA: Diagnosis not present

## 2018-08-18 DIAGNOSIS — J4541 Moderate persistent asthma with (acute) exacerbation: Secondary | ICD-10-CM

## 2018-08-18 DIAGNOSIS — R05 Cough: Secondary | ICD-10-CM | POA: Diagnosis not present

## 2018-08-18 MED ORDER — DOXYCYCLINE HYCLATE 100 MG PO TABS: 100.0000 mg | ORAL_TABLET | Freq: Two times a day (BID) | ORAL | 0 refills | Status: DC

## 2018-08-18 MED ORDER — ALBUTEROL SULFATE (2.5 MG/3ML) 0.083% IN NEBU
2.5000 mg | INHALATION_SOLUTION | Freq: Once | RESPIRATORY_TRACT | Status: AC
  Administered 2018-08-18: 2.5 mg via RESPIRATORY_TRACT

## 2018-08-18 MED ORDER — ALBUTEROL SULFATE (2.5 MG/3ML) 0.083% IN NEBU: 2.5000 mg | INHALATION_SOLUTION | Freq: Four times a day (QID) | RESPIRATORY_TRACT | 11 refills | Status: DC | PRN

## 2018-08-18 MED ORDER — FLUTICASONE-SALMETEROL 250-50 MCG/DOSE IN AEPB: INHALATION_SPRAY | RESPIRATORY_TRACT | 11 refills | Status: DC

## 2018-08-18 MED ORDER — PREDNISONE 20 MG PO TABS: 40.0000 mg | ORAL_TABLET | Freq: Every day | ORAL | 0 refills | Status: AC

## 2018-08-18 NOTE — Progress Notes (Signed)
   Subjective:    Patient ID: Travis Aguilar, male    DOB: November 01, 1990, 28 y.o.   MRN: [id number]  Sinusitis  This is a new problem. Episode onset: over one week. Associated symptoms include congestion, coughing, ear pain, headaches, shortness of breath, sinus pressure and a sore throat. Pertinent negatives include no chills. (Fever, ear pain, wheezing, coughing up blood this morning) Past treatments include acetaminophen and oral decongestants.   Was seen on 12/30 for flu symptoms given tamiflu and prednisone taper, thought he was getting better, worse over the last 2 days.   Reports severe left ear pain - gotten worse over the last 2 days. Reports sweating.  Has used breathing machine 4 times per day the last 2 days, reports hard to breathe. Light-headed.  Has been out of advair inhaler x 1 week due to cost.   Review of Systems  Constitutional: Negative for chills and fever.  HENT: Positive for congestion, ear pain, sinus pressure and sore throat. Negative for ear discharge.   Respiratory: Positive for cough, shortness of breath and wheezing.   Neurological: Positive for headaches.       Objective:   Physical Exam Vitals signs and nursing note reviewed.  Constitutional:      General: He is not in acute distress.    Appearance: Normal appearance. He is not toxic-appearing.  HENT:     Head: Normocephalic and atraumatic.     Right Ear: Tympanic membrane is not erythematous.     Left Ear: Tympanic membrane is not erythematous.     Ears:     Comments: TM dull bilaterally    Nose: Nasal tenderness and congestion present.     Mouth/Throat:     Mouth: Mucous membranes are moist.     Pharynx: Oropharynx is clear.  Eyes:     General:        Right eye: No discharge.        Left eye: No discharge.  Neck:     Musculoskeletal: Neck supple. No neck rigidity.  Cardiovascular:     Rate and Rhythm: Normal rate and regular rhythm.     Heart sounds: Normal heart sounds.  Pulmonary:   Effort: Pulmonary effort is normal. No respiratory distress.     Breath sounds: Wheezing present. No rales.     Comments: Speaking in complete sentences. Lymphadenopathy:     Cervical: No cervical adenopathy.  Skin:    General: Skin is warm and dry.  Neurological:     Mental Status: He is alert and oriented to person, place, and time.  Psychiatric:        Mood and Affect: Mood normal.    Breathing treatment given in office.  Wheezing improved.       Assessment & Plan:  Acute rhinosinusitis  Wheezing - Plan: albuterol (PROVENTIL) (2.5 MG/3ML) 0.083% nebulizer solution 2.5 mg  Moderate persistent asthma with acute exacerbation  Discussed likely post influenza secondary bacterial infection along with asthma exacerbation.  Recommend treatment with doxycycline.  Will do another course of steroids.  Recommend albuterol use every 4 hours until symptoms improve.  Encouraged to fill Advair inhaler as soon as he is able to afford and restart.  Warning signs discussed.  He should follow-up if symptoms worsen or fail to improve.  Dr. Lubertha South was consulted on this case and is in agreement with the above treatment plan.

## 2018-10-25 ENCOUNTER — Encounter (HOSPITAL_COMMUNITY): Payer: Self-pay | Admitting: Emergency Medicine

## 2018-10-25 ENCOUNTER — Other Ambulatory Visit: Payer: Self-pay

## 2018-10-25 ENCOUNTER — Emergency Department (HOSPITAL_COMMUNITY)
Admission: EM | Admit: 2018-10-25 | Discharge: 2018-10-25 | Disposition: A | Discharge status: Home / Self Care | Payer: BLUE CROSS/BLUE SHIELD | Attending: Emergency Medicine | Admitting: Emergency Medicine

## 2018-10-25 DIAGNOSIS — Z209 Contact with and (suspected) exposure to unspecified communicable disease: Secondary | ICD-10-CM | POA: Diagnosis not present

## 2018-10-25 DIAGNOSIS — J101 Influenza due to other identified influenza virus with other respiratory manifestations: Secondary | ICD-10-CM

## 2018-10-25 DIAGNOSIS — Z880 Allergy status to penicillin: Secondary | ICD-10-CM | POA: Insufficient documentation

## 2018-10-25 DIAGNOSIS — J45909 Unspecified asthma, uncomplicated: Secondary | ICD-10-CM | POA: Diagnosis not present

## 2018-10-25 DIAGNOSIS — J029 Acute pharyngitis, unspecified: Secondary | ICD-10-CM | POA: Diagnosis present

## 2018-10-25 LAB — INFLUENZA PANEL BY PCR (TYPE A & B)
Influenza A By PCR: POSITIVE — AB
Influenza B By PCR: NEGATIVE

## 2018-10-25 LAB — GROUP A STREP BY PCR: GROUP A STREP BY PCR: NOT DETECTED

## 2018-10-25 MED ORDER — OSELTAMIVIR PHOSPHATE 75 MG PO CAPS: 75.0000 mg | ORAL_CAPSULE | Freq: Two times a day (BID) | ORAL | 0 refills | Status: DC

## 2018-10-25 MED ORDER — IBUPROFEN 800 MG PO TABS: 800.0000 mg | ORAL_TABLET | Freq: Three times a day (TID) | ORAL | 0 refills | Status: DC | PRN

## 2018-10-25 MED ORDER — IBUPROFEN 800 MG PO TABS
800.0000 mg | ORAL_TABLET | Freq: Once | ORAL | Status: AC
  Administered 2018-10-25: 800 mg via ORAL
  Filled 2018-10-25: qty 1

## 2018-10-25 MED ORDER — ALBUTEROL SULFATE HFA 108 (90 BASE) MCG/ACT IN AERS: 1.0000 | INHALATION_SPRAY | Freq: Four times a day (QID) | RESPIRATORY_TRACT | 0 refills | Status: DC | PRN

## 2018-10-25 MED ORDER — OSELTAMIVIR PHOSPHATE 75 MG PO CAPS
75.0000 mg | ORAL_CAPSULE | Freq: Once | ORAL | Status: AC
  Administered 2018-10-25: 75 mg via ORAL
  Filled 2018-10-25: qty 1

## 2018-10-25 MED ORDER — ALBUTEROL SULFATE (2.5 MG/3ML) 0.083% IN NEBU
2.5000 mg | INHALATION_SOLUTION | Freq: Once | RESPIRATORY_TRACT | Status: AC
  Administered 2018-10-25: 2.5 mg via RESPIRATORY_TRACT
  Filled 2018-10-25: qty 3

## 2018-10-25 NOTE — ED Provider Notes (Signed)
[facility] EMERGENCY DEPARTMENT Provider Note   CSN: [id number] Arrival date & time: 10/25/18  1142    History   Chief Complaint Chief Complaint  Patient presents with  . Otalgia  . Sore Throat    HPI Travis Aguilar is a 28 y.o. male.     HPI Patient reports he started with a little bit of a sore throat about 5 days ago.  He reports symptoms were not particularly severe.  He thought it was maybe a little bit of a cold.  As of yesterday symptoms seemed worse.  He started getting cough and feeling some shortness of breath.  Patient reports he has asthma but does not usually need to use inhalers very often.  He reports he has felt the need to use his inhaler over the past day or 2 now.  He reports he has had some chills thinks maybe he had a fever yesterday.  He has not measured his temperature.  Some mild nausea no active vomiting.  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  He reports there was someone sent home sick 2 weeks ago.  He reports that that person came back to work last week but still apparently seemed sick.  No sick contacts at home.  Patient has no travel history. Past Medical History:  Diagnosis Date  . Asthma     Patient Active Problem List   Diagnosis Date Noted  . Pars defect of lumbar spine 08/18/2014  . Asthma, moderate persistent 06/24/2013    Past Surgical History:  Procedure Laterality Date  . SURGERY OF LIP     age 9 or 82  . TONSILLECTOMY          Home Medications    Prior to Admission medications   Medication Sig Start Date End Date Taking? Authorizing Provider  albuterol (PROVENTIL HFA;VENTOLIN HFA) 108 (90 Base) MCG/ACT inhaler Inhale 1-2 puffs into the lungs every 6 (six) hours as needed for wheezing or shortness of breath. 10/25/18   Arby Barrette, MD  albuterol (PROVENTIL) (2.5 MG/3ML) 0.083% nebulizer solution Take 3 mLs (2.5 mg total) by nebulization every 6 (six) hours as needed for wheezing or  shortness of breath. 08/18/18   Jeannine Boga, NP  albuterol (VENTOLIN HFA) 108 (90 Base) MCG/ACT inhaler INHALE 2 PUFFS BY MOUTH EVERY 6 HOURS AS NEEDED FOR WHEEZING 06/01/18   Luking, Jonna Coup, MD  doxycycline (VIBRA-TABS) 100 MG tablet Take 1 tablet (100 mg total) by mouth 2 (two) times daily. Take with snack and tall glass of water 08/18/18   Jeannine Boga, NP  Fluticasone-Salmeterol (ADVAIR DISKUS) 250-50 MCG/DOSE AEPB INHALE 1 PUFF BY MOUTH EVERY 12 HOURS 08/18/18   Jeannine Boga, NP  ibuprofen (ADVIL,MOTRIN) 800 MG tablet Take 1 tablet (800 mg total) by mouth every 8 (eight) hours as needed. 10/25/18   Arby Barrette, MD  oseltamivir (TAMIFLU) 75 MG capsule Take 1 capsule (75 mg total) by mouth 2 (two) times daily. 10/25/18   Arby Barrette, MD    Family History Family History  Problem Relation Age of Onset  . Cancer Other        breast    Social History Social History   Tobacco Use  . Smoking status: Never Smoker  . Smokeless tobacco: Never Used  Substance Use Topics  . Alcohol use: Yes    Comment: occasionally  . Drug use: No     Allergies   Amoxil [amoxicillin] and Penicillins   Review of Systems Review  of Systems 10 Systems reviewed and are negative for acute change except as noted in the HPI.   Physical Exam Updated Vital Signs BP 136/66 (BP Location: Right Arm)   Pulse 88   Temp 98.4 F (36.9 C) (Oral)   Resp 16   SpO2 98%   Physical Exam Constitutional:      Appearance: He is well-developed.  HENT:     Head: Normocephalic and atraumatic.     Mouth/Throat:     Mouth: Mucous membranes are moist.     Pharynx: Oropharynx is clear.     Comments: No exudates or tonsillar erythema. Eyes:     Pupils: Pupils are equal, round, and reactive to light.  Neck:     Musculoskeletal: Neck supple.  Cardiovascular:     Rate and Rhythm: Normal rate and regular rhythm.     Heart sounds: Normal heart sounds.  Pulmonary:     Effort: Pulmonary effort is normal.      Comments: Scattered expiratory wheeze but good airflow. Abdominal:     General: Bowel sounds are normal. There is no distension.     Palpations: Abdomen is soft.     Tenderness: There is no abdominal tenderness.  Musculoskeletal: Normal range of motion.  Skin:    General: Skin is warm and dry.  Neurological:     Mental Status: He is alert and oriented to person, place, and time.     GCS: GCS eye subscore is 4. GCS verbal subscore is 5. GCS motor subscore is 6.     Coordination: Coordination normal.      ED Treatments / Results  Labs (all labs ordered are listed, but only abnormal results are displayed) Labs Reviewed  INFLUENZA PANEL BY PCR (TYPE A & B) - Abnormal; Notable for the following components:      Result Value   Influenza A By PCR POSITIVE (*)    All other components within normal limits  GROUP A STREP BY PCR    EKG None  Radiology No results found.  Procedures Procedures (including critical care time)  Medications Ordered in ED Medications  oseltamivir (TAMIFLU) capsule 75 mg (has no administration in time range)  albuterol (PROVENTIL) (2.5 MG/3ML) 0.083% nebulizer solution 2.5 mg (2.5 mg Nebulization Given 10/25/18 1325)  ibuprofen (ADVIL,MOTRIN) tablet 800 mg (800 mg Oral Given 10/25/18 1351)     Initial Impression / Assessment and Plan / ED Course  I have reviewed the triage vital signs and the nursing notes.  Pertinent labs & imaging results that were available during my care of the patient were reviewed by me and considered in my medical decision making (see chart for details).       Patient presents with flulike symptoms and is influenza A positive.  He does have asthma.  He is not exhibiting respiratory distress but does have some wheeze.  Recommend using his inhaler for the next 2 to 3 days until symptoms are improving.  Return precautions are reviewed.  Contact precautions are reviewed.  Final Clinical Impressions(s) / ED Diagnoses   Final  diagnoses:  Influenza A    ED Discharge Orders         Ordered    oseltamivir (TAMIFLU) 75 MG capsule  2 times daily     10/25/18 1517    ibuprofen (ADVIL,MOTRIN) 800 MG tablet  Every 8 hours PRN     10/25/18 1517    albuterol (PROVENTIL HFA;VENTOLIN HFA) 108 (90 Base) MCG/ACT inhaler  Every 6 hours PRN  10/25/18 1517           Arby Barrette, MD 10/25/18 1528

## 2018-10-25 NOTE — ED Triage Notes (Signed)
Sore throat since Friday,  Eyes buring, congestion, states used his breathing machine yesterday and used his in haler still feels bad

## 2018-10-25 NOTE — Discharge Instructions (Signed)
1.  You have tested positive for influenza.  Because you have asthma, you are at higher risk for having problems with breathing.  You have been given a prescription to take Tamiflu to try to help your symptoms. 2.  Take your albuterol inhaler every 4-6 hours for the next 3 to 4 days until you start feeling better.  Take ibuprofen 800 mg and extra strength Tylenol if needed for body aches and flu. 3.  Return to the emergency department if your symptoms are worsening in terms of having difficulty breathing getting dehydrated or other concerning symptoms. 4.  Continue to use precautions of careful handwashing and coughing into a tissue for your sleep to avoid spreading influenza to other individuals.

## 2018-10-25 NOTE — ED Notes (Signed)
Patient verbalizes understanding of discharge instructions. Opportunity for questioning and answers were provided. Armband removed by staff, pt discharged from ED.  

## 2018-10-28 ENCOUNTER — Ambulatory Visit (INDEPENDENT_AMBULATORY_CARE_PROVIDER_SITE_OTHER): Payer: BLUE CROSS/BLUE SHIELD | Admitting: Family Medicine

## 2018-10-28 ENCOUNTER — Other Ambulatory Visit: Payer: Self-pay

## 2018-10-28 DIAGNOSIS — J019 Acute sinusitis, unspecified: Secondary | ICD-10-CM

## 2018-10-28 DIAGNOSIS — B9689 Other specified bacterial agents as the cause of diseases classified elsewhere: Secondary | ICD-10-CM | POA: Diagnosis not present

## 2018-10-28 MED ORDER — PREDNISONE 20 MG PO TABS: ORAL_TABLET | ORAL | 0 refills | Status: DC

## 2018-10-28 MED ORDER — CEFPROZIL 500 MG PO TABS: 500.0000 mg | ORAL_TABLET | Freq: Two times a day (BID) | ORAL | 0 refills | Status: DC

## 2018-10-28 NOTE — Progress Notes (Signed)
   Subjective:    Patient ID: Ronold Glomb, male    DOB: 01/02/1991, 28 y.o.   MRN: [id number]  Cough  This is a new problem. Episode onset: Sunday. The cough is productive of sputum (yellow mucus). Associated symptoms include headaches, a sore throat and shortness of breath. Associated symptoms comments: Cough, ear pain, fever, wheezing. Treatments tried: Motrin 800 mg, Tamiflu. The treatment provided no relief.    Went to ER on Sunday and was diagnosed with flu.   Review of Systems  HENT: Positive for sore throat.   Respiratory: Positive for cough and shortness of breath.   Neurological: Positive for headaches.       Objective:   Physical Exam  Alert, mild malaise. Hydration good Vitals stable. frontal/ maxillary tenderness evident positive nasal congestion. pharynx normal neck supple  lungs clear/no crackles or wheezes. heart regular in rhythm       Assessment & Plan:  Impression rhinosinusitis likely post viral, discussed with patient. plan antibiotics prescribed. Questions answered. Symptomatic care discussed. warning signs discussed. WSL Post influenza discussed

## 2019-05-31 ENCOUNTER — Encounter: Payer: Self-pay | Admitting: Family Medicine

## 2019-05-31 ENCOUNTER — Ambulatory Visit (INDEPENDENT_AMBULATORY_CARE_PROVIDER_SITE_OTHER): Payer: BC Managed Care – PPO | Admitting: Family Medicine

## 2019-05-31 ENCOUNTER — Other Ambulatory Visit: Payer: Self-pay

## 2019-05-31 DIAGNOSIS — Z20822 Contact with and (suspected) exposure to covid-19: Secondary | ICD-10-CM

## 2019-05-31 DIAGNOSIS — Z20828 Contact with and (suspected) exposure to other viral communicable diseases: Secondary | ICD-10-CM | POA: Diagnosis not present

## 2019-05-31 DIAGNOSIS — J4541 Moderate persistent asthma with (acute) exacerbation: Secondary | ICD-10-CM | POA: Diagnosis not present

## 2019-05-31 MED ORDER — ALBUTEROL SULFATE (2.5 MG/3ML) 0.083% IN NEBU: 2.5000 mg | INHALATION_SOLUTION | Freq: Four times a day (QID) | RESPIRATORY_TRACT | 0 refills | Status: DC | PRN

## 2019-05-31 MED ORDER — AZITHROMYCIN 250 MG PO TABS: ORAL_TABLET | ORAL | 0 refills | Status: DC

## 2019-05-31 MED ORDER — PREDNISONE 20 MG PO TABS: ORAL_TABLET | ORAL | 0 refills | Status: DC

## 2019-05-31 NOTE — Progress Notes (Signed)
   Subjective:  Audio plus video  Patient ID: Travis Aguilar, male    DOB: September 22, 1990, 28 y.o.   MRN: [id number]  Cough This is a new problem. The current episode started yesterday. Associated symptoms include chills, ear pain, headaches, nasal congestion and a sore throat. Associated symptoms comments: Vomited this am. Treatments tried: hot tea.      Review of Systems  Constitutional: Positive for chills.  HENT: Positive for ear pain and sore throat.   Respiratory: Positive for cough.   Neurological: Positive for headaches.   Virtual Visit via Video Note  I connected with Janmichael Giraud on 05/31/19 at 11:30 AM EDT by a video enabled telemedicine application and verified that I am speaking with the correct person using two identifiers.  Location: Patient: home Provider: office   I discussed the limitations of evaluation and management by telemedicine and the availability of in person appointments. The patient expressed understanding and agreed to proceed.  History of Present Illness:    Observations/Objective: On time team  Assessment and Plan:   Follow Up Instructions:    I discussed the assessment and treatment plan with the patient. The patient was provided an opportunity to ask questions and all were answered. The patient agreed with the plan and demonstrated an understanding of the instructions.   The patient was advised to call back or seek an in-person evaluation if the symptoms worsen or if the condition fails to improve as anticipated.  I provided 18 minutes of non-face-to-face time during this encounter.  Patient has noted distinct worsening of his wheezing.  Some tightness in the chest.  Using his albuterol via machine every 4 or 5 hours.  No significant fever.  Positive headache positive sore throat positive cough several day runny nose prodrome with hard yesterday      Objective:   Physical Exam   Virtual moderate now as noted     Assessment & Plan:   Impression acute viral syndrome with exacerbation of asthma.  Prednisone taper.  Z-Pak.  Albuterol proper use discussed.  Warning signs discussed.  COVID-19 testing strongly encouraged

## 2019-06-01 ENCOUNTER — Other Ambulatory Visit: Payer: Self-pay

## 2019-06-01 DIAGNOSIS — Z20822 Contact with and (suspected) exposure to covid-19: Secondary | ICD-10-CM

## 2019-06-02 LAB — NOVEL CORONAVIRUS, NAA: SARS-CoV-2, NAA: NOT DETECTED

## 2019-06-03 ENCOUNTER — Other Ambulatory Visit: Payer: Self-pay | Admitting: Family Medicine

## 2019-06-30 ENCOUNTER — Ambulatory Visit (INDEPENDENT_AMBULATORY_CARE_PROVIDER_SITE_OTHER): Payer: BC Managed Care – PPO | Admitting: Family Medicine

## 2019-06-30 ENCOUNTER — Encounter: Payer: Self-pay | Admitting: Family Medicine

## 2019-06-30 DIAGNOSIS — J4541 Moderate persistent asthma with (acute) exacerbation: Secondary | ICD-10-CM

## 2019-06-30 DIAGNOSIS — B9689 Other specified bacterial agents as the cause of diseases classified elsewhere: Secondary | ICD-10-CM | POA: Diagnosis not present

## 2019-06-30 DIAGNOSIS — J019 Acute sinusitis, unspecified: Secondary | ICD-10-CM | POA: Diagnosis not present

## 2019-06-30 MED ORDER — LEVOFLOXACIN 500 MG PO TABS: ORAL_TABLET | ORAL | 0 refills | Status: DC

## 2019-06-30 MED ORDER — FLUTICASONE-SALMETEROL 250-50 MCG/DOSE IN AEPB: INHALATION_SPRAY | RESPIRATORY_TRACT | 3 refills | Status: DC

## 2019-06-30 MED ORDER — PREDNISONE 20 MG PO TABS: ORAL_TABLET | ORAL | 0 refills | Status: DC

## 2019-06-30 NOTE — Progress Notes (Signed)
   Subjective:  Audio only  Patient ID: Travis Aguilar, male    DOB: 1991-04-22, 28 y.o.   MRN: [id number]  Cough This is a new problem. Episode onset: 2 days ago  The cough is productive of sputum. Associated symptoms include wheezing. Associated symptoms comments: Right ear popping and pain in right ear . Treatments tried: nebulizer machine, hot tea. The treatment provided mild relief.   Virtual Visit via Telephone Note  I connected with Travis Aguilar on 06/30/19 at  3:00 PM EST by telephone and verified that I am speaking with the correct person using two identifiers.  Location: Patient: home Provider: office   I discussed the limitations, risks, security and privacy concerns of performing an evaluation and management service by telephone and the availability of in person appointments. I also discussed with the patient that there may be a patient responsible charge related to this service. The patient expressed understanding and agreed to proceed.   History of Present Illness:    Observations/Objective:   Assessment and Plan:   Follow Up Instructions:    I discussed the assessment and treatment plan with the patient. The patient was provided an opportunity to ask questions and all were answered. The patient agreed with the plan and demonstrated an understanding of the instructions.   The patient was advised to call back or seek an in-person evaluation if the symptoms worsen or if the condition fails to improve as anticipated.  I provided 16 minutes of non-face-to-face time during this encounter.   Vicente Males, LPN  Positive history of wheezing.  Fair amount of difficulty with this for the last month.  Has had moderate persistent asthma in the past requiring Advair.  See prior notes.  Never completely got over.  Now productive cough again deep in the chest.  Substantial wheezing.  Also sinus drainage.  Irritated throat.  Checked last month for COVID-19 no new exposures per  patient he feels it is more of the same infection that we treated a month ago  Review of Systems  Respiratory: Positive for cough and wheezing.   No vomiting no diarrhea no rash     Objective:   Physical Exam   Virtual     Assessment & Plan:  Impression moderate persistent asthma with acute exacerbation and persistent sinusitis/bronchitis.  Levaquin prescribed.  Prednisone taper.  Resume Advair.  Maintain for wheeze next several months rationale discussed

## 2019-06-30 NOTE — Addendum Note (Signed)
Addended by: Vicente Males on: 06/30/2019 03:35 PM   Modules accepted: Orders

## 2019-07-01 ENCOUNTER — Telehealth: Payer: Self-pay | Admitting: Family Medicine

## 2019-07-01 NOTE — Telephone Encounter (Signed)
Levaquin, advair, and prednisone sent in yesterday. Pt states he thought he was suppose to get 2 antibiotics and the pharm told him to call us  On the antibiotic and the advair. I called pharm and was told the advair was ready for pickup with a $10 copay so not sure why they told him there was an issue with advair but will let pt know when I call him back about the antibiotic. levaquin sent in and note states he needs zpack and prednsione. Please advise if he needs zpack also.

## 2019-07-01 NOTE — Telephone Encounter (Signed)
Definitely not ,we called in all the right things

## 2019-07-01 NOTE — Telephone Encounter (Signed)
Pt states pharmacy told him to call office regarding his Fluticasone-Salmeterol (ADVAIR DISKUS) 250-50 MCG/DOSE AEPB.   Pt would like a call to know what he needs to do.

## 2019-07-01 NOTE — Telephone Encounter (Signed)
Tried to call no answer

## 2019-07-01 NOTE — Telephone Encounter (Signed)
Pt notified he should take levaquin, prednisone and advair and that pharm told me the advair was ready and it was a 10 dollar copay. Pt verbalzied understanding of all

## 2019-08-16 ENCOUNTER — Encounter: Payer: Self-pay | Admitting: Family Medicine

## 2019-08-16 ENCOUNTER — Other Ambulatory Visit: Payer: Self-pay

## 2019-08-16 ENCOUNTER — Ambulatory Visit (INDEPENDENT_AMBULATORY_CARE_PROVIDER_SITE_OTHER): Payer: BC Managed Care – PPO | Admitting: Family Medicine

## 2019-08-16 DIAGNOSIS — B9689 Other specified bacterial agents as the cause of diseases classified elsewhere: Secondary | ICD-10-CM

## 2019-08-16 DIAGNOSIS — J019 Acute sinusitis, unspecified: Secondary | ICD-10-CM

## 2019-08-16 MED ORDER — DOXYCYCLINE HYCLATE 100 MG PO TABS: 100.0000 mg | ORAL_TABLET | Freq: Two times a day (BID) | ORAL | 0 refills | Status: DC

## 2019-08-16 NOTE — Progress Notes (Signed)
   Subjective:  Audio only  Patient ID: Travis Aguilar, male    DOB: [birth year], 29 y.o.   MRN: [id number]  Cough This is a new problem. Episode onset: 5 days. Associated symptoms include shortness of breath and wheezing. Associated symptoms comments: Sore throat, ear pain, congestion. Treatments tried: hot tea, neb treatments.    Virtual Visit via Telephone Note  I connected with Travis Aguilar on 08/16/19 at  2:00 PM EST by telephone and verified that I am speaking with the correct person using two identifiers.  Location: Patient: home Provider: office   I discussed the limitations, risks, security and privacy concerns of performing an evaluation and management service by telephone and the availability of in person appointments. I also discussed with the patient that there may be a patient responsible charge related to this service. The patient expressed understanding and agreed to proceed.   History of Present Illness:    Observations/Objective:   Assessment and Plan:   Follow Up Instructions:    I discussed the assessment and treatment plan with the patient. The patient was provided an opportunity to ask questions and all were answered. The patient agreed with the plan and demonstrated an understanding of the instructions.   The patient was advised to call back or seek an in-person evaluation if the symptoms worsen or if the condition fails to improve as anticipated.  I provided 18 minutes of non-face-to-face time during this encounter.   No vomiting no diarrhea  Ongoing facial pain congestion.  Stuffiness.  Gunky discharge.  1 weeks duration.  Mostly has not flared up asthma.  Rare use of rescue inhaler.  Review of Systems  Respiratory: Positive for cough, shortness of breath and wheezing.        Objective:   Physical Exam  Virtual      Assessment & Plan:  Impression rhinosinusitis with slight flare of asthma.  Overall daily preventive agent working well.   Antibiotics prescribed.  Patient fairly adamant that this is not COVID-19.  He declines retesting.  See prior notes.  I advised him strongly to avoid older relatives.  Symptom care discussed

## 2019-09-13 ENCOUNTER — Encounter: Payer: Self-pay | Admitting: Family Medicine

## 2019-09-29 ENCOUNTER — Ambulatory Visit (INDEPENDENT_AMBULATORY_CARE_PROVIDER_SITE_OTHER): Payer: BC Managed Care – PPO | Admitting: Family Medicine

## 2019-09-29 ENCOUNTER — Other Ambulatory Visit: Payer: Self-pay

## 2019-09-29 DIAGNOSIS — H9202 Otalgia, left ear: Secondary | ICD-10-CM

## 2019-09-29 DIAGNOSIS — R05 Cough: Secondary | ICD-10-CM

## 2019-09-29 DIAGNOSIS — B9689 Other specified bacterial agents as the cause of diseases classified elsewhere: Secondary | ICD-10-CM

## 2019-09-29 DIAGNOSIS — J019 Acute sinusitis, unspecified: Secondary | ICD-10-CM

## 2019-09-29 MED ORDER — IBUPROFEN 800 MG PO TABS: ORAL_TABLET | ORAL | 0 refills | Status: DC

## 2019-09-29 MED ORDER — CLARITHROMYCIN 500 MG PO TABS: ORAL_TABLET | ORAL | 0 refills | Status: DC

## 2019-09-29 NOTE — Progress Notes (Signed)
   Subjective:  Audio only  Patient ID: Travis Aguilar, male    DOB: Aug 18, 1990, 29 y.o.   MRN: [id number]  HPI Pt is having congestion, left ear pain and cough. Pt is coughing up yellow mucus. Pt states the left ear pain has been hurting for a while. Pt states he believes he got the symptoms from his children.  Virtual Visit via Telephone Note  I connected with Travis Aguilar on 09/29/19 at  3:00 PM EST by telephone and verified that I am speaking with the correct person using two identifiers.  Location: Patient: home Provider: office   I discussed the limitations, risks, security and privacy concerns of performing an evaluation and management service by telephone and the availability of in person appointments. I also discussed with the patient that there may be a patient responsible charge related to this service. The patient expressed understanding and agreed to proceed.   History of Present Illness:    Observations/Objective:   Assessment and Plan:   Follow Up Instructions:    I discussed the assessment and treatment plan with the patient. The patient was provided an opportunity to ask questions and all were answered. The patient agreed with the plan and demonstrated an understanding of the instructions.   The patient was advised to call back or seek an in-person evaluation if the symptoms worsen or if the condition fails to improve as anticipated.  I provided 21 minutes of non-face-to-face time during this encounter.   Left ear hurting  Painful frontal region along with left ear  No fever   Coughing a lot  sonme cough  Took tylenol  Energy down  Review of Systems No vomiting no diarrhea no fever    Objective:   Physical Exam  Virtual      Assessment & Plan:  Impression probable rhinosinusitis with left otitis media.  Discussed.  Options discussed.  Covid potential discussed.  Patient reluctant to test.  Warning signs discussed carefully.  Antibiotics  prescribed

## 2019-11-29 ENCOUNTER — Telehealth: Payer: Self-pay | Admitting: *Deleted

## 2019-11-29 ENCOUNTER — Other Ambulatory Visit: Payer: Self-pay

## 2019-11-29 ENCOUNTER — Telehealth (INDEPENDENT_AMBULATORY_CARE_PROVIDER_SITE_OTHER): Payer: BC Managed Care – PPO | Admitting: Family Medicine

## 2019-11-29 DIAGNOSIS — J4541 Moderate persistent asthma with (acute) exacerbation: Secondary | ICD-10-CM

## 2019-11-29 MED ORDER — PREDNISONE 20 MG PO TABS: ORAL_TABLET | ORAL | 0 refills | Status: DC

## 2019-11-29 NOTE — Telephone Encounter (Signed)
[name], tulloch are scheduled for a virtual visit with your provider today.    Just as we do with appointments in the office, we must obtain your consent to participate.  Your consent will be active for this visit and any virtual visit you may have with one of our providers in the next 365 days.    If you have a MyChart account, I can also send a copy of this consent to you electronically.  All virtual visits are billed to your insurance company just like a traditional visit in the office.  As this is a virtual visit, video technology does not allow for your provider to perform a traditional examination.  This may limit your provider's ability to fully assess your condition.  If your provider identifies any concerns that need to be evaluated in person or the need to arrange testing such as labs, EKG, etc, we will make arrangements to do so.    Although advances in technology are sophisticated, we cannot ensure that it will always work on either your end or our end.  If the connection with a video visit is poor, we may have to switch to a telephone visit.  With either a video or telephone visit, we are not always able to ensure that we have a secure connection.   I need to obtain your verbal consent now.   Are you willing to proceed with your visit today?   Travis Aguilar has provided verbal consent on 11/29/2019 for a virtual visit (video or telephone).   Haze Rushing, LPN 01/06/7823  23:53 AM

## 2019-11-29 NOTE — Progress Notes (Signed)
   Subjective:  Audio only  Patient ID: Travis Aguilar, male    DOB: February 28, 1991, 29 y.o.   MRN: [id number]  HPI Patient calls today with complaints of runny nose, cough and congestion.   Patient states his asthma has been giving him a hard time because he is so congested. Using nebulizer 2 x per day for the last couple of days. Virtual Visit via Video Note  I connected with Travis Aguilar on 11/29/19 at  3:00 PM EDT by a video enabled telemedicine application and verified that I am speaking with the correct person using two identifiers.  Location: Patient: home Provider: office   I discussed the limitations of evaluation and management by telemedicine and the availability of in person appointments. The patient expressed understanding and agreed to proceed.  History of Present Illness:    Observations/Objective:   Assessment and Plan:   Follow Up Instructions:    I discussed the assessment and treatment plan with the patient. The patient was provided an opportunity to ask questions and all were answered. The patient agreed with the plan and demonstrated an understanding of the instructions.   The patient was advised to call back or seek an in-person evaluation if the symptoms worsen or if the condition fails to improve as anticipated.  I provided 21 minutes of non-face-to-face time during this encounter.   On further history patient really feels allergies triggered all of this.  Congestion drainage.  Sneezing.  Then subsequent cough.  Utilizing nebulizer twice daily.  Review of Systems No fever no chills    Objective:   Physical Exam  Virtual      Assessment & Plan:  Impression progressive acceleration of allergic rhinitis and now asthma.  Prednisone taper prescribed.  Warning signs discussed

## 2020-02-26 DIAGNOSIS — R0981 Nasal congestion: Secondary | ICD-10-CM | POA: Diagnosis not present

## 2020-02-26 DIAGNOSIS — H9209 Otalgia, unspecified ear: Secondary | ICD-10-CM | POA: Diagnosis not present

## 2020-02-26 DIAGNOSIS — Z6841 Body Mass Index (BMI) 40.0 and over, adult: Secondary | ICD-10-CM | POA: Diagnosis not present

## 2020-05-07 ENCOUNTER — Ambulatory Visit
Admission: EM | Admit: 2020-05-07 | Discharge: 2020-05-07 | Disposition: A | Discharge status: Home / Self Care | Payer: BC Managed Care – PPO | Attending: Emergency Medicine | Admitting: Emergency Medicine

## 2020-05-07 ENCOUNTER — Encounter: Payer: Self-pay | Admitting: Emergency Medicine

## 2020-05-07 DIAGNOSIS — J069 Acute upper respiratory infection, unspecified: Secondary | ICD-10-CM | POA: Diagnosis not present

## 2020-05-07 DIAGNOSIS — Z1152 Encounter for screening for COVID-19: Secondary | ICD-10-CM

## 2020-05-07 MED ORDER — BENZONATATE 100 MG PO CAPS: 100.0000 mg | ORAL_CAPSULE | Freq: Three times a day (TID) | ORAL | 0 refills | Status: DC

## 2020-05-07 MED ORDER — AZITHROMYCIN 250 MG PO TABS: 250.0000 mg | ORAL_TABLET | Freq: Every day | ORAL | 0 refills | Status: DC

## 2020-05-07 MED ORDER — DEXAMETHASONE 4 MG PO TABS: 4.0000 mg | ORAL_TABLET | Freq: Every day | ORAL | 0 refills | Status: AC

## 2020-05-07 NOTE — ED Provider Notes (Signed)
[facility]   [id number] 05/07/20 Arrival Time: 1240   CC: COVID symptoms  SUBJECTIVE: History from: patient.  Travis Aguilar is a 29 y.o. male history of asthma presented to the urgent care with a complaint of nasal congestion, cough, sore throat and ear pain for the past 3 days.  Denies sick exposure to COVID, flu or strep.  Denies recent travel.  Has tried OTC medication without relief.  Denies aggravating factors.  Denies previous symptoms in the past.   Denies fever, chills, fatigue, sinus pain, rhinorrhea, sore throat, SOB, wheezing, chest pain, nausea, changes in bowel or bladder habits.    ROS: As per HPI.  All other pertinent ROS negative.      Past Medical History:  Diagnosis Date  . Asthma    Past Surgical History:  Procedure Laterality Date  . SURGERY OF LIP     age 54 or 42  . TONSILLECTOMY     Allergies  Allergen Reactions  . Amoxil [Amoxicillin] Nausea And Vomiting  . Penicillins Nausea And Vomiting   No current facility-administered medications on file prior to encounter.   Current Outpatient Medications on File Prior to Encounter  Medication Sig Dispense Refill  . albuterol (PROVENTIL HFA;VENTOLIN HFA) 108 (90 Base) MCG/ACT inhaler Inhale 1-2 puffs into the lungs every 6 (six) hours as needed for wheezing or shortness of breath. 1 Inhaler 0  . albuterol (PROVENTIL) (2.5 MG/3ML) 0.083% nebulizer solution Take 3 mLs (2.5 mg total) by nebulization every 6 (six) hours as needed for wheezing or shortness of breath. 150 mL 0  . Fluticasone-Salmeterol (ADVAIR DISKUS) 250-50 MCG/DOSE AEPB INHALE 1 PUFF BY MOUTH EVERY 12 HOURS 60 each 3  . ibuprofen (IBU) 800 MG tablet Take one tablet po TID with food prn pain 36 tablet 0  . predniSONE (DELTASONE) 20 MG tablet Take 3 qd for 3 days, then 2 qd for 3 days, then one qd for 3 days 18 tablet 0   Social History   Socioeconomic History  . Marital status: Single    Spouse name: Not on file  . Number of children:  Not on file  . Years of education: Not on file  . Highest education level: Not on file  Occupational History  . Not on file  Tobacco Use  . Smoking status: Never Smoker  . Smokeless tobacco: Never Used  Substance and Sexual Activity  . Alcohol use: Yes    Comment: occasionally  . Drug use: No  . Sexual activity: Not on file  Other Topics Concern  . Not on file  Social History Narrative  . Not on file   Social Determinants of Health   Financial Resource Strain:   . Difficulty of Paying Living Expenses: Not on file  Food Insecurity:   . Worried About Programme researcher, broadcasting/film/video in the Last Year: Not on file  . Ran Out of Food in the Last Year: Not on file  Transportation Needs:   . Lack of Transportation (Medical): Not on file  . Lack of Transportation (Non-Medical): Not on file  Physical Activity:   . Days of Exercise per Week: Not on file  . Minutes of Exercise per Session: Not on file  Stress:   . Feeling of Stress : Not on file  Social Connections:   . Frequency of Communication with Friends and Family: Not on file  . Frequency of Social Gatherings with Friends and Family: Not on file  . Attends Religious Services: Not on file  .  Active Member of Clubs or Organizations: Not on file  . Attends Banker Meetings: Not on file  . Marital Status: Not on file  Intimate Partner Violence:   . Fear of Current or Ex-Partner: Not on file  . Emotionally Abused: Not on file  . Physically Abused: Not on file  . Sexually Abused: Not on file   Family History  Problem Relation Age of Onset  . Cancer Other        breast    OBJECTIVE:  Vitals:   05/07/20 1250  BP: 121/77  Pulse: 84  Resp: 16  Temp: 98.1 F (36.7 C)  SpO2: 99%     General appearance: alert; appears fatigued, but nontoxic; speaking in full sentences and tolerating own secretions HEENT: NCAT; Ears: EACs clear, TMs pearly gray; Eyes: PERRL.  EOM grossly intact. Sinuses: nontender; Nose: nares patent  without rhinorrhea, Throat: oropharynx clear, tonsils non erythematous or enlarged, uvula midline  Neck: supple without LAD Lungs: unlabored respirations, symmetrical air entry; cough: present; no respiratory distress; CTAB Heart: regular rate and rhythm.  Radial pulses 2+ symmetrical bilaterally Skin: warm and dry Psychological: alert and cooperative; normal mood and affect  LABS:  No results found for this or any previous visit (from the past 24 hour(s)).   ASSESSMENT & PLAN:  1. Acute URI   2. Encounter for screening for COVID-19     Meds ordered this encounter  Medications  . benzonatate (TESSALON) 100 MG capsule    Sig: Take 1 capsule (100 mg total) by mouth every 8 (eight) hours.    Dispense:  21 capsule    Refill:  0  . dexamethasone (DECADRON) 4 MG tablet    Sig: Take 1 tablet (4 mg total) by mouth daily for 7 days.    Dispense:  7 tablet    Refill:  0  . azithromycin (ZITHROMAX) 250 MG tablet    Sig: Take 1 tablet (250 mg total) by mouth daily. Take first 2 tablets together, then 1 every day until finished.    Dispense:  6 tablet    Refill:  0   Patient is stable at discharge.  COVID-19 test was completed for rule out  Discharge instructions  Covid test will take 2 to 7 days for results to return someone will call you if your result is positive.  Get plenty of rest and push fluids Tessalon Perles prescribed for cough Decadron as prescribed Azithromycin was prescribed Use OTC flonase for nasal congestion and runny nose Use medications daily for symptom relief Use OTC medications like ibuprofen or tylenol as needed fever or pain Call or go to the ED if you have any new or worsening symptoms such as fever, worsening cough, shortness of breath, chest tightness, chest pain, turning blue, changes in mental status, etc...    Reviewed expectations re: course of current medical issues. Questions answered. Outlined signs and symptoms indicating need for more acute  intervention. Patient verbalized understanding. After Visit Summary given.         Durward Parcel, FNP 05/07/20 1337

## 2020-05-07 NOTE — Discharge Instructions (Addendum)
Covid test will take 2 to 7 days for results to return someone will call you if your result is positive.  Get plenty of rest and push fluids Tessalon Perles prescribed for cough Decadron as prescribed Azithromycin was prescribed Use OTC flonase for nasal congestion and runny nose Use medications daily for symptom relief Use OTC medications like ibuprofen or tylenol as needed fever or pain Call or go to the ED if you have any new or worsening symptoms such as fever, worsening cough, shortness of breath, chest tightness, chest pain, turning blue, changes in mental status, etc..Marland Kitchen

## 2020-05-07 NOTE — ED Triage Notes (Signed)
states he has ear pain, sinus congeation, eyes burning and feels like his asthma is acting up. no exposure to COVID

## 2020-05-09 LAB — SARS-COV-2, NAA 2 DAY TAT

## 2020-05-09 LAB — NOVEL CORONAVIRUS, NAA: SARS-CoV-2, NAA: NOT DETECTED

## 2020-05-15 DIAGNOSIS — H60503 Unspecified acute noninfective otitis externa, bilateral: Secondary | ICD-10-CM | POA: Diagnosis not present

## 2020-05-15 DIAGNOSIS — Z6841 Body Mass Index (BMI) 40.0 and over, adult: Secondary | ICD-10-CM | POA: Diagnosis not present

## 2020-07-13 ENCOUNTER — Other Ambulatory Visit: Payer: Self-pay

## 2020-07-13 ENCOUNTER — Ambulatory Visit
Admission: EM | Admit: 2020-07-13 | Discharge: 2020-07-13 | Disposition: A | Discharge status: Home / Self Care | Payer: BC Managed Care – PPO | Attending: Emergency Medicine | Admitting: Emergency Medicine

## 2020-07-13 DIAGNOSIS — J4521 Mild intermittent asthma with (acute) exacerbation: Secondary | ICD-10-CM

## 2020-07-13 DIAGNOSIS — J069 Acute upper respiratory infection, unspecified: Secondary | ICD-10-CM | POA: Diagnosis not present

## 2020-07-13 MED ORDER — PREDNISONE 10 MG (21) PO TBPK: ORAL_TABLET | Freq: Every day | ORAL | 0 refills | Status: DC

## 2020-07-13 MED ORDER — DEXAMETHASONE SODIUM PHOSPHATE 10 MG/ML IJ SOLN
10.0000 mg | Freq: Once | INTRAMUSCULAR | Status: AC
  Administered 2020-07-13: 10 mg via INTRAMUSCULAR

## 2020-07-13 NOTE — ED Triage Notes (Signed)
Pt presents with asthma flare and ear ache for past couple days also has ear pain

## 2020-07-13 NOTE — Discharge Instructions (Signed)
Declines covid test Get plenty of rest and push fluids Steroid shot given in office Prednisone prescribed.  Take as directed and to completion Use OTC zyrtec for nasal congestion, runny nose, and/or sore throat Use OTC flonase for nasal congestion and runny nose Use medications daily for symptom relief Use OTC medications like ibuprofen or tylenol as needed fever or pain Follow up with PCP for recheck Call or go to the ED if you have any new or worsening symptoms such as fever, worsening cough, shortness of breath, chest tightness, chest pain, turning blue, changes in mental status, etc..Marland Kitchen

## 2020-07-13 NOTE — ED Provider Notes (Signed)
[facility] CARE CENTER   [id number] 07/13/20 Arrival Time: 0818   CC: COVID symptoms  SUBJECTIVE: History from: patient.  Travis Aguilar is a 29 y.o. male who presents with nasal congestion, ear pressure, ears popping, productive cough, chest tightness, and wheezing x 4 days.  Denies sick exposure to COVID, flu or strep.  Has tried breathing treatments at home with minimal relief.  Denies aggravating factors.  Denies previous COVID infection.   Denies fever, sinus pain, rhinorrhea, sore throat, SOB, chest pain, nausea, changes in bowel or bladder habits.    ROS: As per HPI.  All other pertinent ROS negative.     Past Medical History:  Diagnosis Date  . Asthma    Past Surgical History:  Procedure Laterality Date  . SURGERY OF LIP     age 63 or 46  . TONSILLECTOMY     Allergies  Allergen Reactions  . Amoxil [Amoxicillin] Nausea And Vomiting  . Penicillins Nausea And Vomiting   No current facility-administered medications on file prior to encounter.   Current Outpatient Medications on File Prior to Encounter  Medication Sig Dispense Refill  . albuterol (PROVENTIL HFA;VENTOLIN HFA) 108 (90 Base) MCG/ACT inhaler Inhale 1-2 puffs into the lungs every 6 (six) hours as needed for wheezing or shortness of breath. 1 Inhaler 0  . albuterol (PROVENTIL) (2.5 MG/3ML) 0.083% nebulizer solution Take 3 mLs (2.5 mg total) by nebulization every 6 (six) hours as needed for wheezing or shortness of breath. 150 mL 0  . Fluticasone-Salmeterol (ADVAIR DISKUS) 250-50 MCG/DOSE AEPB INHALE 1 PUFF BY MOUTH EVERY 12 HOURS 60 each 3  . ibuprofen (IBU) 800 MG tablet Take one tablet po TID with food prn pain 36 tablet 0   Social History   Socioeconomic History  . Marital status: Single    Spouse name: Not on file  . Number of children: Not on file  . Years of education: Not on file  . Highest education level: Not on file  Occupational History  . Not on file  Tobacco Use  . Smoking status: Never  Smoker  . Smokeless tobacco: Never Used  Substance and Sexual Activity  . Alcohol use: Yes    Comment: occasionally  . Drug use: No  . Sexual activity: Not on file  Other Topics Concern  . Not on file  Social History Narrative  . Not on file   Social Determinants of Health   Financial Resource Strain:   . Difficulty of Paying Living Expenses: Not on file  Food Insecurity:   . Worried About Programme researcher, broadcasting/film/video in the Last Year: Not on file  . Ran Out of Food in the Last Year: Not on file  Transportation Needs:   . Lack of Transportation (Medical): Not on file  . Lack of Transportation (Non-Medical): Not on file  Physical Activity:   . Days of Exercise per Week: Not on file  . Minutes of Exercise per Session: Not on file  Stress:   . Feeling of Stress : Not on file  Social Connections:   . Frequency of Communication with Friends and Family: Not on file  . Frequency of Social Gatherings with Friends and Family: Not on file  . Attends Religious Services: Not on file  . Active Member of Clubs or Organizations: Not on file  . Attends Banker Meetings: Not on file  . Marital Status: Not on file  Intimate Partner Violence:   . Fear of Current or Ex-Partner: Not on file  .  Emotionally Abused: Not on file  . Physically Abused: Not on file  . Sexually Abused: Not on file   Family History  Problem Relation Age of Onset  . Cancer Other        breast    OBJECTIVE:  Vitals:   07/13/20 0830  BP: (!) 145/93  Pulse: 86  Resp: 17  Temp: (!) 97.5 F (36.4 C)  TempSrc: Oral  SpO2: 97%     General appearance: alert; appears fatigued, but nontoxic; speaking in full sentences and tolerating own secretions HEENT: NCAT; Ears: EACs clear, TMs pearly gray; Eyes: PERRL.  EOM grossly intact. Nose: nares patent without rhinorrhea, Throat: oropharynx clear, tonsils non erythematous or enlarged, uvula midline  Neck: supple without LAD Lungs: unlabored respirations,  symmetrical air entry; cough: mild; no respiratory distress; wheezing heard over LT lung field Heart: regular rate and rhythm.   Skin: warm and dry Psychological: alert and cooperative; normal mood and affect  ASSESSMENT & PLAN:  1. Viral URI with cough   2. Mild intermittent asthma with exacerbation     Meds ordered this encounter  Medications  . predniSONE (STERAPRED UNI-PAK 21 TAB) 10 MG (21) TBPK tablet    Sig: Take by mouth daily. Take 6 tabs by mouth daily  for 2 days, then 5 tabs for 2 days, then 4 tabs for 2 days, then 3 tabs for 2 days, 2 tabs for 2 days, then 1 tab by mouth daily for 2 days    Dispense:  42 tablet    Refill:  0    Order Specific Question:   Supervising Provider    Answer:   Eustace Moore [[id number]]  . dexamethasone (DECADRON) injection 10 mg    Declines covid test Get plenty of rest and push fluids Steroid shot given in office Prednisone prescribed.  Take as directed and to completion Use OTC zyrtec for nasal congestion, runny nose, and/or sore throat Use OTC flonase for nasal congestion and runny nose Use medications daily for symptom relief Use OTC medications like ibuprofen or tylenol as needed fever or pain Follow up with PCP for recheck Call or go to the ED if you have any new or worsening symptoms such as fever, worsening cough, shortness of breath, chest tightness, chest pain, turning blue, changes in mental status, etc...   Reviewed expectations re: course of current medical issues. Questions answered. Outlined signs and symptoms indicating need for more acute intervention. Patient verbalized understanding. After Visit Summary given.         Rennis Harding, PA-C 07/13/20 [phone]

## 2021-01-16 ENCOUNTER — Ambulatory Visit
Admission: EM | Admit: 2021-01-16 | Discharge: 2021-01-16 | Disposition: A | Discharge status: Home / Self Care | Payer: BLUE CROSS/BLUE SHIELD | Attending: Family Medicine | Admitting: Family Medicine

## 2021-01-16 ENCOUNTER — Encounter: Payer: Self-pay | Admitting: Emergency Medicine

## 2021-01-16 ENCOUNTER — Other Ambulatory Visit: Payer: Self-pay

## 2021-01-16 DIAGNOSIS — H66001 Acute suppurative otitis media without spontaneous rupture of ear drum, right ear: Secondary | ICD-10-CM | POA: Diagnosis not present

## 2021-01-16 MED ORDER — PREDNISONE 10 MG (21) PO TBPK: ORAL_TABLET | Freq: Every day | ORAL | 0 refills | Status: AC

## 2021-01-16 MED ORDER — CEFDINIR 300 MG PO CAPS: 300.0000 mg | ORAL_CAPSULE | Freq: Two times a day (BID) | ORAL | 0 refills | Status: AC

## 2021-01-16 NOTE — Discharge Instructions (Addendum)
Prescribed Cefdinir for you to take twice a day for 10 days  I have sent in a prednisone taper for you to take for 6 days. 6 tablets on day one, 5 tablets on day two, 4 tablets on day three, 3 tablets on day four, 2 tablets on day five, and 1 tablet on day six.  Follow up with this office or with primary care if symptoms are persisting.  Follow up in the ER for high fever, trouble swallowing, trouble breathing, other concerning symptoms.

## 2021-01-16 NOTE — ED Triage Notes (Signed)
Left sided ear pain, sore throat, states he felt light headed yesterday with fever, nasal congestion symptoms became worse yesterday.  At home covid test was negative this morning.

## 2021-01-16 NOTE — ED Provider Notes (Signed)
[facility] CARE CENTER   [id number] 01/16/21 Arrival Time: 1100  WP:VXYI THROAT  SUBJECTIVE: History from: patient.  Travis Aguilar is a 30 y.o. male who presents with abrupt onset of sore throat for the last 2-3 days. Reports right ear pain as well. States that his son is sick at home. Has tried tylenol with minimal relief. Reports negative Covid test this morning. Has negative history of Covid. Has not completed Covid vaccines. Has not completed flu vaccine. Symptoms are made worse with swallowing, but tolerating liquids and own secretions without difficulty.  Denies previous symptoms in the past.     Denies fever, chills, fatigue, sinus pain, rhinorrhea, nasal congestion, cough, SOB, wheezing, chest pain, nausea, rash, changes in bowel or bladder habits.    ROS: As per HPI.  All other pertinent ROS negative.     Past Medical History:  Diagnosis Date  . Asthma    Past Surgical History:  Procedure Laterality Date  . SURGERY OF LIP     age 43 or 39  . TONSILLECTOMY     Allergies  Allergen Reactions  . Amoxil [Amoxicillin] Nausea And Vomiting  . Penicillins Nausea And Vomiting   No current facility-administered medications on file prior to encounter.   Current Outpatient Medications on File Prior to Encounter  Medication Sig Dispense Refill  . albuterol (PROVENTIL HFA;VENTOLIN HFA) 108 (90 Base) MCG/ACT inhaler Inhale 1-2 puffs into the lungs every 6 (six) hours as needed for wheezing or shortness of breath. 1 Inhaler 0  . albuterol (PROVENTIL) (2.5 MG/3ML) 0.083% nebulizer solution Take 3 mLs (2.5 mg total) by nebulization every 6 (six) hours as needed for wheezing or shortness of breath. 150 mL 0  . Fluticasone-Salmeterol (ADVAIR DISKUS) 250-50 MCG/DOSE AEPB INHALE 1 PUFF BY MOUTH EVERY 12 HOURS 60 each 3  . ibuprofen (IBU) 800 MG tablet Take one tablet po TID with food prn pain 36 tablet 0   Social History   Socioeconomic History  . Marital status: Single    Spouse name:  Not on file  . Number of children: Not on file  . Years of education: Not on file  . Highest education level: Not on file  Occupational History  . Not on file  Tobacco Use  . Smoking status: Never Smoker  . Smokeless tobacco: Never Used  Substance and Sexual Activity  . Alcohol use: Yes    Comment: occasionally  . Drug use: No  . Sexual activity: Not on file  Other Topics Concern  . Not on file  Social History Narrative  . Not on file   Social Determinants of Health   Financial Resource Strain: Not on file  Food Insecurity: Not on file  Transportation Needs: Not on file  Physical Activity: Not on file  Stress: Not on file  Social Connections: Not on file  Intimate Partner Violence: Not on file   Family History  Problem Relation Age of Onset  . Cancer Other        breast  . Healthy Mother   . Diabetes Father     OBJECTIVE:  Vitals:   01/16/21 1124  BP: 122/75  Pulse: 91  Resp: 16  Temp: 98 F (36.7 C)  TempSrc: Oral  SpO2: 98%     General appearance: alert; appears fatigued, but nontoxic, speaking in full sentences and managing own secretions HEENT: NCAT; Ears: EACs clear, L TM pearly gray with visible cone of light, R TM erythematous, bulging with effusion; Eyes: PERRL, EOMI grossly; Nose: no  obvious rhinorrhea; Throat: oropharynx clear, tonsils 1+ and mildly erythematous without white tonsillar exudates, uvula midline Neck: supple with LAD Lungs: CTA bilaterally without adventitious breath sounds; cough absent Heart: regular rate and rhythm.  Radial pulses 2+ symmetrical bilaterally Skin: warm and dry Psychological: alert and cooperative; normal mood and affect  LABS: No results found for this or any previous visit (from the past 24 hour(s)).   ASSESSMENT & PLAN:  1. Non-recurrent acute suppurative otitis media of right ear without spontaneous rupture of tympanic membrane     Meds ordered this encounter  Medications  . cefdinir (OMNICEF) 300 MG  capsule    Sig: Take 1 capsule (300 mg total) by mouth 2 (two) times daily for 10 days.    Dispense:  20 capsule    Refill:  0    Order Specific Question:   Supervising Provider    Answer:   Merrilee Jansky [id number]  . predniSONE (STERAPRED UNI-PAK 21 TAB) 10 MG (21) TBPK tablet    Sig: Take by mouth daily for 6 days. Take 6 tablets on day 1, 5 tablets on day 2, 4 tablets on day 3, 3 tablets on day 4, 2 tablets on day 5, 1 tablet on day 6    Dispense:  21 tablet    Refill:  0    Order Specific Question:   Supervising Provider    Answer:   Merrilee Jansky [[id number]]    Prescribed Cefdinir 300mg  BID x 7 days Steroid taper prescribed Get plenty of rest and push fluids Take OTC Zyrtec and use chloraseptic spray as needed for throat pain. Drink warm or cool liquids, use throat lozenges, or popsicles to help alleviate symptoms Take OTC ibuprofen or tylenol as needed for pain Follow up with PCP if symptoms persists Return or go to ER if patient has any new or worsening symptoms such as fever, chills, nausea, vomiting, worsening sore throat, cough, abdominal pain, chest pain, changes in bowel or bladder habits  Reviewed expectations re: course of current medical issues. Questions answered. Outlined signs and symptoms indicating need for more acute intervention. Patient verbalized understanding. After Visit Summary given.          , NP 01/16/21 1219

## 2021-05-06 ENCOUNTER — Ambulatory Visit
Admission: EM | Admit: 2021-05-06 | Discharge: 2021-05-06 | Disposition: A | Discharge status: Home / Self Care | Payer: BLUE CROSS/BLUE SHIELD | Attending: Emergency Medicine | Admitting: Emergency Medicine

## 2021-05-06 ENCOUNTER — Other Ambulatory Visit: Payer: Self-pay

## 2021-05-06 DIAGNOSIS — H1013 Acute atopic conjunctivitis, bilateral: Secondary | ICD-10-CM | POA: Diagnosis not present

## 2021-05-06 HISTORY — DX: Allergy status to unspecified drugs, medicaments and biological substances: Z88.9

## 2021-05-06 MED ORDER — OLOPATADINE HCL 0.1 % OP SOLN: 1.0000 [drp] | Freq: Two times a day (BID) | OPHTHALMIC | 12 refills | Status: DC

## 2021-05-06 MED ORDER — OFLOXACIN 0.3 % OP SOLN: 1.0000 [drp] | Freq: Four times a day (QID) | OPHTHALMIC | 0 refills | Status: DC

## 2021-05-06 NOTE — Discharge Instructions (Addendum)
Use eye drops as prescribed and to completion Wash pillow cases, wash hands regularly with soap and water, avoid touching your face and eyes, wash door handles, light switches, remotes and other objects you frequently touch Return or follow up with PCP if symptoms persists such as fever, chills, redness, swelling, eye pain, painful eye movements, vision changes, etc... 

## 2021-05-06 NOTE — ED Triage Notes (Signed)
Patient presents to Urgent Care with complaints of eye irritation and redness to bilateral eyes since Tuesday. He reports getting over a cold a week ago. Pt states he has a hx of allergies. Pt states his has been taking his allergy medications and OTC eye drops with no improvement. Pt is concerned with possible pink eye.   Denies fever or changes in vision.

## 2021-05-06 NOTE — ED Provider Notes (Signed)
[facility] CARE CENTER   [id number] 05/06/21 Arrival Time: 1034  CC: Red eye  SUBJECTIVE:  Travis Aguilar is a 30 y.o. male who presents to the urgent care with a complaint of bilateral eye irritation ,redness and itchiness for the past 3 to 4 days.  Denies a precipitating event, trauma, or close contacts with similar symptoms.  Has tried OTC Visine eye drops without relief.  Denies alleviating or aggravating factors.  Denies similar symptoms in the past.  Denies fever, chills, nausea, vomiting, eye pain, painful eye movements, halos, discharge, itching, vision changes, double vision, FB sensation, periorbital erythema.     Denies contact lens use.    ROS: As per HPI.  All other pertinent ROS negative.     Past Medical History:  Diagnosis Date   Asthma    Hx of seasonal allergies    Past Surgical History:  Procedure Laterality Date   SURGERY OF LIP     age 88 or 37   TONSILLECTOMY     Allergies  Allergen Reactions   Amoxil [Amoxicillin] Nausea And Vomiting   Penicillins Nausea And Vomiting   No current facility-administered medications on file prior to encounter.   Current Outpatient Medications on File Prior to Encounter  Medication Sig Dispense Refill   albuterol (PROVENTIL HFA;VENTOLIN HFA) 108 (90 Base) MCG/ACT inhaler Inhale 1-2 puffs into the lungs every 6 (six) hours as needed for wheezing or shortness of breath. 1 Inhaler 0   albuterol (PROVENTIL) (2.5 MG/3ML) 0.083% nebulizer solution Take 3 mLs (2.5 mg total) by nebulization every 6 (six) hours as needed for wheezing or shortness of breath. 150 mL 0   Fluticasone-Salmeterol (ADVAIR DISKUS) 250-50 MCG/DOSE AEPB INHALE 1 PUFF BY MOUTH EVERY 12 HOURS 60 each 3   ibuprofen (IBU) 800 MG tablet Take one tablet po TID with food prn pain 36 tablet 0   Social History   Socioeconomic History   Marital status: Single    Spouse name: Not on file   Number of children: Not on file   Years of education: Not on file   Highest  education level: Not on file  Occupational History   Not on file  Tobacco Use   Smoking status: Never   Smokeless tobacco: Never  Vaping Use   Vaping Use: Never used  Substance and Sexual Activity   Alcohol use: Yes    Comment: occasionally   Drug use: No   Sexual activity: Not on file  Other Topics Concern   Not on file  Social History Narrative   Not on file   Social Determinants of Health   Financial Resource Strain: Not on file  Food Insecurity: Not on file  Transportation Needs: Not on file  Physical Activity: Not on file  Stress: Not on file  Social Connections: Not on file  Intimate Partner Violence: Not on file   Family History  Problem Relation Age of Onset   Cancer Other        breast   Healthy Mother    Diabetes Father     OBJECTIVE:    Patent attorney Acuity  Right Eye Distance:   Left Eye Distance:   Bilateral Distance:    Right Eye Near:   Left Eye Near:    Bilateral Near:      Vitals:   05/06/21 1136  BP: 124/81  Pulse: 78  Resp: 16  Temp: 98.3 F (36.8 C)  TempSrc: Oral  SpO2: 98%    General appearance: alert; no distress Eyes:  mild conjunctival erythema. PERRL; EOMI without discomfort;  no obvious drainage; lid everted without obvious FB; no obvious fluorescein uptake  Neck: supple Lungs: clear to auscultation bilaterally Heart: regular rate and rhythm Skin: warm and dry Psychological: alert and cooperative; normal mood and affect   ASSESSMENT & PLAN:  1. Allergic conjunctivitis of both eyes     Meds ordered this encounter  Medications   olopatadine (PATADAY) 0.1 % ophthalmic solution    Sig: Place 1 drop into both eyes 2 (two) times daily.    Dispense:  5 mL    Refill:  12   ofloxacin (OCUFLOX) 0.3 % ophthalmic solution    Sig: Place 1 drop into both eyes 4 (four) times daily.    Dispense:  5 mL    Refill:  0   Discharge instructions  Use eye drops as prescribed and to completion Wash pillow cases, wash hands regularly  with soap and water, avoid touching your face and eyes, wash door handles, light switches, remotes and other objects you frequently touch Return or follow up with PCP if symptoms persists such as fever, chills, redness, swelling, eye pain, painful eye movements, vision changes, etc...  Reviewed expectations re: course of current medical issues. Questions answered. Outlined signs and symptoms indicating need for more acute intervention. Patient verbalized understanding. After Visit Summary given.    Durward Parcel, FNP 05/06/21 1207

## 2021-05-28 ENCOUNTER — Emergency Department (HOSPITAL_COMMUNITY)
Admission: EM | Admit: 2021-05-28 | Discharge: 2021-05-28 | Disposition: A | Discharge status: Home / Self Care | Payer: BLUE CROSS/BLUE SHIELD | Attending: Emergency Medicine | Admitting: Emergency Medicine

## 2021-05-28 ENCOUNTER — Other Ambulatory Visit: Payer: Self-pay

## 2021-05-28 ENCOUNTER — Encounter (HOSPITAL_COMMUNITY): Payer: Self-pay | Admitting: *Deleted

## 2021-05-28 ENCOUNTER — Emergency Department (HOSPITAL_COMMUNITY): Payer: BLUE CROSS/BLUE SHIELD

## 2021-05-28 ENCOUNTER — Ambulatory Visit
Admission: EM | Admit: 2021-05-28 | Discharge: 2021-05-28 | Disposition: A | Discharge status: Home / Self Care | Payer: BLUE CROSS/BLUE SHIELD

## 2021-05-28 DIAGNOSIS — R202 Paresthesia of skin: Secondary | ICD-10-CM | POA: Insufficient documentation

## 2021-05-28 DIAGNOSIS — J4530 Mild persistent asthma, uncomplicated: Secondary | ICD-10-CM | POA: Insufficient documentation

## 2021-05-28 DIAGNOSIS — R072 Precordial pain: Secondary | ICD-10-CM | POA: Insufficient documentation

## 2021-05-28 DIAGNOSIS — J4520 Mild intermittent asthma, uncomplicated: Secondary | ICD-10-CM | POA: Diagnosis not present

## 2021-05-28 DIAGNOSIS — R079 Chest pain, unspecified: Secondary | ICD-10-CM

## 2021-05-28 DIAGNOSIS — Z7951 Long term (current) use of inhaled steroids: Secondary | ICD-10-CM | POA: Insufficient documentation

## 2021-05-28 DIAGNOSIS — R0789 Other chest pain: Secondary | ICD-10-CM

## 2021-05-28 LAB — CBC
HCT: 43.7 % (ref 39.0–52.0)
Hemoglobin: 14.4 g/dL (ref 13.0–17.0)
MCH: 31.2 pg (ref 26.0–34.0)
MCHC: 33 g/dL (ref 30.0–36.0)
MCV: 94.8 fL (ref 80.0–100.0)
Platelets: 316 10*3/uL (ref 150–400)
RBC: 4.61 MIL/uL (ref 4.22–5.81)
RDW: 13.5 % (ref 11.5–15.5)
WBC: 10.9 10*3/uL — ABNORMAL HIGH (ref 4.0–10.5)
nRBC: 0 % (ref 0.0–0.2)

## 2021-05-28 LAB — BASIC METABOLIC PANEL
Anion gap: 7 (ref 5–15)
BUN: 19 mg/dL (ref 6–20)
CO2: 26 mmol/L (ref 22–32)
Calcium: 8.7 mg/dL — ABNORMAL LOW (ref 8.9–10.3)
Chloride: 105 mmol/L (ref 98–111)
Creatinine, Ser: 0.69 mg/dL (ref 0.61–1.24)
GFR, Estimated: >60 mL/min (ref >60)
Glucose, Bld: 94 mg/dL (ref 70–99)
Potassium: 4 mmol/L (ref 3.5–5.1)
Sodium: 138 mmol/L (ref 135–145)

## 2021-05-28 LAB — TROPONIN I (HIGH SENSITIVITY)
Troponin I (High Sensitivity): 2 ng/L (ref <18)
Troponin I (High Sensitivity): 3 ng/L (ref <18)

## 2021-05-28 MED ORDER — IBUPROFEN 600 MG PO TABS: 600.0000 mg | ORAL_TABLET | Freq: Three times a day (TID) | ORAL | 0 refills | Status: AC

## 2021-05-28 NOTE — ED Provider Notes (Signed)
[facility] EMERGENCY DEPARTMENT Provider Note   CSN: [id number] Arrival date & time: 05/28/21  1755     History Chief Complaint  Patient presents with   Chest Pain    Travis Aguilar is a 30 y.o. male.  HPI  HPI: A 30 year old patient with a history of obesity presents for evaluation of chest pain. Initial onset of pain was approximately 1-3 hours ago. The patient's chest pain is described as heaviness/pressure/tightness and is not worse with exertion. The patient's chest pain is middle- or left-sided, is not well-localized, is not sharp and does radiate to the arms/jaw/neck. The patient does not complain of nausea and denies diaphoresis. The patient has no history of stroke, has no history of peripheral artery disease, has not smoked in the past 90 days, denies any history of treated diabetes, has no relevant family history of coronary artery disease (first degree relative at less than age 70), is not hypertensive and has no history of hypercholesterolemia.   Patient reports left-sided pressure-like chest pain that woke him up at 1:00 in the morning yesterday.  Pain lasted for couple of hours.  When he woke up in the morning he was fine, however around 4:30 started having chest pain again, and there was some left upper extremity numbness associated with it that prompted him to go to the ER.  He denies any neck pain.  No exertional, pleuritic component to the pain.  No association with position or food intake.  Work does involve heavy lifting.  No GERD symptoms. Past Medical History:  Diagnosis Date   Asthma    Hx of seasonal allergies     Patient Active Problem List   Diagnosis Date Noted   Pars defect of lumbar spine 08/18/2014   Asthma, moderate persistent 06/24/2013    Past Surgical History:  Procedure Laterality Date   SURGERY OF LIP     age 66 or 31   TONSILLECTOMY         Family History  Problem Relation Age of Onset   Cancer Other        breast   Healthy Mother     Diabetes Father     Social History   Tobacco Use   Smoking status: Never   Smokeless tobacco: Never  Vaping Use   Vaping Use: Never used  Substance Use Topics   Alcohol use: Yes    Comment: occasionally   Drug use: No    Home Medications Prior to Admission medications   Medication Sig Start Date End Date Taking? Authorizing Provider  ibuprofen (ADVIL) 600 MG tablet Take 1 tablet (600 mg total) by mouth 3 (three) times daily. 05/28/21  Yes Derwood Kaplan, MD  albuterol (PROVENTIL HFA;VENTOLIN HFA) 108 (90 Base) MCG/ACT inhaler Inhale 1-2 puffs into the lungs every 6 (six) hours as needed for wheezing or shortness of breath. 10/25/18   Arby Barrette, MD  albuterol (PROVENTIL) (2.5 MG/3ML) 0.083% nebulizer solution Take 3 mLs (2.5 mg total) by nebulization every 6 (six) hours as needed for wheezing or shortness of breath. 05/31/19   Merlyn Albert, MD  Fluticasone-Salmeterol (ADVAIR DISKUS) 250-50 MCG/DOSE AEPB INHALE 1 PUFF BY MOUTH EVERY 12 HOURS 06/30/19   Merlyn Albert, MD  ofloxacin (OCUFLOX) 0.3 % ophthalmic solution Place 1 drop into both eyes 4 (four) times daily. 05/06/21   Avegno, Zachery Dakins, FNP  olopatadine (PATADAY) 0.1 % ophthalmic solution Place 1 drop into both eyes 2 (two) times daily. 05/06/21   Durward Parcel, FNP  Allergies    Amoxil [amoxicillin] and Penicillins  Review of Systems   Review of Systems  Constitutional:  Positive for activity change. Negative for diaphoresis.  Respiratory:  Positive for chest tightness. Negative for cough and shortness of breath.   Gastrointestinal:  Negative for nausea and vomiting.  Neurological:  Positive for numbness.   Physical Exam Updated Vital Signs BP 118/74   Pulse 65   Temp (!) 97.3 F (36.3 C) (Oral)   Resp (!) 21   Ht 5\' 7"  (1.702 m)   Wt 126.6 kg   SpO2 98%   BMI 43.71 kg/m   Physical Exam Vitals and nursing note reviewed.  Constitutional:      Appearance: He is well-developed.  HENT:      Head: Atraumatic.  Cardiovascular:     Rate and Rhythm: Normal rate.  Pulmonary:     Effort: Pulmonary effort is normal.     Breath sounds: No decreased breath sounds, wheezing, rhonchi or rales.  Musculoskeletal:     Cervical back: Neck supple.  Skin:    General: Skin is warm.  Neurological:     Mental Status: He is alert and oriented to person, place, and time.    ED Results / Procedures / Treatments   Labs (all labs ordered are listed, but only abnormal results are displayed) Labs Reviewed  BASIC METABOLIC PANEL - Abnormal; Notable for the following components:      Result Value   Calcium 8.7 (*)    All other components within normal limits  CBC - Abnormal; Notable for the following components:   WBC 10.9 (*)    All other components within normal limits  TROPONIN I (HIGH SENSITIVITY)  TROPONIN I (HIGH SENSITIVITY)    EKG EKG Interpretation  Date/Time:  Monday May 28 2021 18:11:53 EDT Ventricular Rate:  81 PR Interval:  148 QRS Duration: 102 QT Interval:  384 QTC Calculation: 446 R Axis:   88 Text Interpretation: Normal sinus rhythm Incomplete right bundle branch block Borderline ECG No acute changes [id number] Confirmed by 10-04-1982 Derwood Kaplan) on 05/28/2021 8:16:30 PM  Radiology DG Chest 2 View  Result Date: 05/28/2021 CLINICAL DATA:  Left chest pain. EXAM: CHEST - 2 VIEW COMPARISON:  Chest x-ray 04/13/2014. FINDINGS: The heart size and mediastinal contours are within normal limits. There is minimal strandy opacities in the left lower lung. There is no focal lung consolidation. No pleural effusion or pneumothorax. Visualized skeletal structures are unremarkable. IMPRESSION: Minimal strandy opacities in the left lower lung favored as atelectasis. Mild infection not excluded. No lung consolidation. Electronically Signed   By: 06/13/2014 M.D.   On: 05/28/2021 18:51    Procedures Procedures   Medications Ordered in ED Medications - No data to display  ED  Course  I have reviewed the triage vital signs and the nursing notes.  Pertinent labs & imaging results that were available during my care of the patient were reviewed by me and considered in my medical decision making (see chart for details).    MDM Rules/Calculators/A&P HEAR Score: 2                         30 year old comes in with chief complaint of chest pain.  Left-sided chest pressure with numbness in the left upper extremity.  Hear score is 2.  X-ray is equivocal for infection, however clinically there is no pneumonia. Possibly musculoskeletal etiology.  Does not appear to be GI in nature.  Troponins and due to negative, does not appear to be typical for ACS either.  Stable for discharge with NSAIDs and strict ER return precautions.  Final Clinical Impression(s) / ED Diagnoses Final diagnoses:  Precordial chest pain  Nonspecific chest pain    Rx / DC Orders ED Discharge Orders          Ordered    ibuprofen (ADVIL) 600 MG tablet  3 times daily        05/28/21 2103             Derwood Kaplan, MD 05/28/21 2110

## 2021-05-28 NOTE — ED Triage Notes (Signed)
Pt presents with left side chest pain that began at 1 am this morning rated 10/10. Pain has been consistent. Also has c/o left arm numbness

## 2021-05-28 NOTE — Discharge Instructions (Addendum)
-  Head straight to the emergency department.  Please have your friend or family member drive the vehicle.  If symptoms get worse on the way, stop and call 911.

## 2021-05-28 NOTE — ED Provider Notes (Signed)
In the left side [facility]    CSN: [id number] Arrival date & time: 05/28/21  1706      History   Chief Complaint Chief Complaint  Patient presents with   Chest Pain    HPI Travis Aguilar is a 30 y.o. male presenting with left-sided chest pain that began about 16 hours ago.  Medical history asthma.  Describes continuous sensation of his chest with some left arm numbness that began while he was asleep 16 hours ago.  Denies shortness of breath, wheezing; does not feel like asthma so he did not use his inhaler.  Feeling well otherwise, denies lightheadedness, dizziness, weakness, cough, fever/chills.  HPI  Past Medical History:  Diagnosis Date   Asthma    Hx of seasonal allergies     Patient Active Problem List   Diagnosis Date Noted   Pars defect of lumbar spine 08/18/2014   Asthma, moderate persistent 06/24/2013    Past Surgical History:  Procedure Laterality Date   SURGERY OF LIP     age 66 or 21   TONSILLECTOMY         Home Medications    Prior to Admission medications   Medication Sig Start Date End Date Taking? Authorizing Provider  albuterol (PROVENTIL HFA;VENTOLIN HFA) 108 (90 Base) MCG/ACT inhaler Inhale 1-2 puffs into the lungs every 6 (six) hours as needed for wheezing or shortness of breath. 10/25/18   Arby Barrette, MD  albuterol (PROVENTIL) (2.5 MG/3ML) 0.083% nebulizer solution Take 3 mLs (2.5 mg total) by nebulization every 6 (six) hours as needed for wheezing or shortness of breath. 05/31/19   Merlyn Albert, MD  Fluticasone-Salmeterol (ADVAIR DISKUS) 250-50 MCG/DOSE AEPB INHALE 1 PUFF BY MOUTH EVERY 12 HOURS 06/30/19   Merlyn Albert, MD  ibuprofen (IBU) 800 MG tablet Take one tablet po TID with food prn pain 09/29/19   Merlyn Albert, MD  ofloxacin (OCUFLOX) 0.3 % ophthalmic solution Place 1 drop into both eyes 4 (four) times daily. 05/06/21   Avegno, Zachery Dakins, FNP  olopatadine (PATADAY) 0.1 % ophthalmic solution Place 1 drop  into both eyes 2 (two) times daily. 05/06/21   Avegno, Zachery Dakins, FNP    Family History Family History  Problem Relation Age of Onset   Cancer Other        breast   Healthy Mother    Diabetes Father     Social History Social History   Tobacco Use   Smoking status: Never   Smokeless tobacco: Never  Vaping Use   Vaping Use: Never used  Substance Use Topics   Alcohol use: Yes    Comment: occasionally   Drug use: No     Allergies   Amoxil [amoxicillin] and Penicillins   Review of Systems Review of Systems  Cardiovascular:  Positive for chest pain.  All other systems reviewed and are negative.   Physical Exam Triage Vital Signs ED Triage Vitals  Enc Vitals Group     BP 05/28/21 1715 (!) 148/91     Pulse Rate 05/28/21 1715 78     Resp 05/28/21 1715 18     Temp 05/28/21 1715 98.2 F (36.8 C)     Temp Source 05/28/21 1715 Tympanic     SpO2 05/28/21 1715 98 %     Weight --      Height --      Head Circumference --      Peak Flow --      Pain Score 05/28/21  1719 10     Pain Loc --      Pain Edu? --      Excl. in GC? --    No data found.  Updated Vital Signs BP (!) 148/91 (BP Location: Right Arm)   Pulse 78   Temp 98.2 F (36.8 C) (Tympanic)   Resp 18   SpO2 98%   Visual Acuity Right Eye Distance:   Left Eye Distance:   Bilateral Distance:    Right Eye Near:   Left Eye Near:    Bilateral Near:     Physical Exam Vitals reviewed.  Constitutional:      Appearance: Normal appearance. He is obese. He is not diaphoretic.  HENT:     Head: Normocephalic and atraumatic.     Mouth/Throat:     Mouth: Mucous membranes are moist.  Eyes:     Extraocular Movements: Extraocular movements intact.     Pupils: Pupils are equal, round, and reactive to light.  Cardiovascular:     Rate and Rhythm: Normal rate and regular rhythm.     Pulses:          Radial pulses are 2+ on the right side and 2+ on the left side.     Heart sounds: Normal heart sounds.   Pulmonary:     Effort: Pulmonary effort is normal.     Breath sounds: Decreased breath sounds present. No wheezing, rhonchi or rales.     Comments: Decreased breath sounds throughout  Chest:     Chest wall: No tenderness.  Abdominal:     Palpations: Abdomen is soft.     Tenderness: There is no abdominal tenderness. There is no guarding or rebound.  Musculoskeletal:     Right lower leg: No edema.     Left lower leg: No edema.  Skin:    General: Skin is warm.     Capillary Refill: Capillary refill takes less than 2 seconds.  Neurological:     General: No focal deficit present.     Mental Status: He is alert and oriented to person, place, and time.  Psychiatric:        Mood and Affect: Mood normal.        Behavior: Behavior normal.        Thought Content: Thought content normal.        Judgment: Judgment normal.     UC Treatments / Results  Labs (all labs ordered are listed, but only abnormal results are displayed) Labs Reviewed - No data to display  EKG   Radiology No results found.  Procedures Procedures (including critical care time)  Medications Ordered in UC Medications - No data to display  Initial Impression / Assessment and Plan / UC Course  I have reviewed the triage vital signs and the nursing notes.  Pertinent labs & imaging results that were available during my care of the patient were reviewed by me and considered in my medical decision making (see chart for details).     This patient is a very pleasant 30 y.o. year old male presenting with chest pain.  Afebrile, nontachycardic.  This patient does have asthma, breath sounds are decreased throughout.  Following albuterol inhaler, no improvement.  EKG NSR, no prior EKG for comparison.   Please head to the emergency department for further evaluation and cardiac work-up.  Patient is in agreement.  He declines transport via EMS in favor of personal vehicle driven by friend.    Final Clinical  Impressions(s) /  UC Diagnoses   Final diagnoses:  Atypical chest pain  Mild intermittent asthma without complication     Discharge Instructions      -Head straight to the emergency department.  Please have your friend or family member drive the vehicle.  If symptoms get worse on the way, stop and call 911.     ED Prescriptions   None    PDMP not reviewed this encounter.   Rhys Martini, PA-C 05/28/21 1744

## 2021-05-28 NOTE — Discharge Instructions (Addendum)
We saw you in the ER for chest pain. All the results in the ER are normal, labs and imaging. We are not sure what is causing your symptoms. The workup in the ER is not complete, and is limited to screening for life threatening and emergent conditions only, so please see a primary care doctor for further evaluation. 

## 2021-05-28 NOTE — ED Triage Notes (Signed)
Chest pain onset this am around 0400

## 2021-05-28 NOTE — ED Provider Notes (Signed)
Emergency Medicine Provider Triage Evaluation Note  Lysander Calixte , a 30 y.o. male  was evaluated in triage.  Pt complains of left-sided chest pain that radiates to the left arm.  Started acutely last night, its been constant.  Feels like a squeezing pain.  There is also associated shortness of breath.  Patient seen in urgent care before arriving, history of asthma given albuterol inhaler.  They are concerned about the chest pain plus the diminished lung sounds.  Sent him to the ED for additional evaluation..  Review of Systems  Positive: Chest pain, shortness of breath Negative: Nausea, vomiting  Physical Exam  BP 137/76   Pulse 76   Temp (!) 97.3 F (36.3 C) (Oral)   Resp 18   Ht 5\' 7"  (1.702 m)   Wt 126.6 kg   SpO2 99%   BMI 43.71 kg/m  Gen:   Awake, no distress   Resp:  Normal effort  MSK:   Moves extremities without difficulty  Other:  Not in any respiratory distress, no wheezing  Medical Decision Making  Medically screening exam initiated at 6:42 PM.  Appropriate orders placed.  Moustapha Tooker was informed that the remainder of the evaluation will be completed by another provider, this initial triage assessment does not replace that evaluation, and the importance of remaining in the ED until their evaluation is complete.  Chest pain work-up   Shellia Carwin, PA-C 05/28/21 1845    05/30/21, MD 06/05/21 1055

## 2021-07-29 ENCOUNTER — Ambulatory Visit
Admission: EM | Admit: 2021-07-29 | Discharge: 2021-07-29 | Disposition: A | Discharge status: Home / Self Care | Payer: BLUE CROSS/BLUE SHIELD | Attending: Family Medicine | Admitting: Family Medicine

## 2021-07-29 ENCOUNTER — Other Ambulatory Visit: Payer: Self-pay

## 2021-07-29 ENCOUNTER — Encounter: Payer: Self-pay | Admitting: *Deleted

## 2021-07-29 DIAGNOSIS — J0100 Acute maxillary sinusitis, unspecified: Secondary | ICD-10-CM

## 2021-07-29 DIAGNOSIS — J4531 Mild persistent asthma with (acute) exacerbation: Secondary | ICD-10-CM | POA: Diagnosis not present

## 2021-07-29 DIAGNOSIS — J3089 Other allergic rhinitis: Secondary | ICD-10-CM | POA: Diagnosis not present

## 2021-07-29 MED ORDER — FLUTICASONE-SALMETEROL 250-50 MCG/DOSE IN AEPB: INHALATION_SPRAY | RESPIRATORY_TRACT | 0 refills | Status: DC

## 2021-07-29 MED ORDER — PREDNISONE 20 MG PO TABS: 40.0000 mg | ORAL_TABLET | Freq: Every day | ORAL | 0 refills | Status: DC

## 2021-07-29 MED ORDER — POLYMYXIN B-TRIMETHOPRIM 10000-0.1 UNIT/ML-% OP SOLN: 1.0000 [drp] | Freq: Four times a day (QID) | OPHTHALMIC | 0 refills | Status: DC

## 2021-07-29 MED ORDER — AZITHROMYCIN 250 MG PO TABS: ORAL_TABLET | ORAL | 0 refills | Status: DC

## 2021-07-29 NOTE — ED Triage Notes (Signed)
Pt reports testing positive for influenza 2 wks ago - states completed course of Tamiflu, inhaler, and course of oral steroids.  States has not had any improvement.  C/O wheezing, bilat ear pain (L>R), sore throat, and bilat eye redness w/ crusting. Denies fevers.

## 2021-07-29 NOTE — ED Provider Notes (Signed)
[facility]    CSN: [id number] Arrival date & time: 07/29/21  0804      History   Chief Complaint Chief Complaint  Patient presents with   Cough   Otalgia   Eye Problem    HPI Travis Aguilar is a 30 y.o. male.   Presenting today with 2-week history of ongoing symptoms following influenza diagnosis.  States he is having worsening. productive cough, wheezing, chest tightness, bilateral ear pain, sore throat, bilateral eye redness, crusting, itching and irritation.  So far has taken a course of Tamiflu, breathing treatments every 4 hours, albuterol inhaler as needed and a course of oral steroids.  No improvement with this course.  Does have a history of asthma on albuterol as needed and seasonal allergies but only takes allergy medication in the spring and fall and has stopped it for the season.  Denies recent fevers.  No new sick contacts.   Past Medical History:  Diagnosis Date   Asthma    Hx of seasonal allergies    Influenza A     Patient Active Problem List   Diagnosis Date Noted   Pars defect of lumbar spine 08/18/2014   Asthma, moderate persistent 06/24/2013    Past Surgical History:  Procedure Laterality Date   SURGERY OF LIP     age 56 or 74   TONSILLECTOMY         Home Medications    Prior to Admission medications   Medication Sig Start Date End Date Taking? Authorizing Provider  albuterol (PROVENTIL HFA;VENTOLIN HFA) 108 (90 Base) MCG/ACT inhaler Inhale 1-2 puffs into the lungs every 6 (six) hours as needed for wheezing or shortness of breath. 10/25/18  Yes Arby Barrette, MD  albuterol (PROVENTIL) (2.5 MG/3ML) 0.083% nebulizer solution Take 3 mLs (2.5 mg total) by nebulization every 6 (six) hours as needed for wheezing or shortness of breath. 05/31/19  Yes Merlyn Albert, MD  azithromycin (ZITHROMAX) 250 MG tablet Take first 2 tablets together, then 1 every day until finished. 07/29/21  Yes Particia Nearing, PA-C  ibuprofen (ADVIL)  600 MG tablet Take 1 tablet (600 mg total) by mouth 3 (three) times daily. 05/28/21  Yes Derwood Kaplan, MD  predniSONE (DELTASONE) 20 MG tablet Take 2 tablets (40 mg total) by mouth daily with breakfast. 07/29/21  Yes Particia Nearing, PA-C  trimethoprim-polymyxin b (POLYTRIM) ophthalmic solution Place 1 drop into both eyes every 6 (six) hours. 07/29/21  Yes Particia Nearing, PA-C  Fluticasone-Salmeterol (ADVAIR) 250-50 MCG/DOSE AEPB INHALE 1 PUFF BY MOUTH EVERY 12 HOURS 07/29/21   Particia Nearing, PA-C  ofloxacin (OCUFLOX) 0.3 % ophthalmic solution Place 1 drop into both eyes 4 (four) times daily. 05/06/21   Avegno, Zachery Dakins, FNP  olopatadine (PATADAY) 0.1 % ophthalmic solution Place 1 drop into both eyes 2 (two) times daily. 05/06/21   Avegno, Zachery Dakins, FNP    Family History Family History  Problem Relation Age of Onset   Cancer Other        breast   Healthy Mother    Diabetes Father     Social History Social History   Tobacco Use   Smoking status: Never   Smokeless tobacco: Never  Vaping Use   Vaping Use: Never used  Substance Use Topics   Alcohol use: Not Currently   Drug use: No     Allergies   Amoxil [amoxicillin] and Penicillins   Review of Systems Review of Systems Per HPI  Physical Exam  Triage Vital Signs ED Triage Vitals  Enc Vitals Group     BP 07/29/21 0813 117/81     Pulse Rate 07/29/21 0813 95     Resp 07/29/21 0813 (!) 22     Temp 07/29/21 0813 (!) 97.4 F (36.3 C)     Temp Source 07/29/21 0813 Oral     SpO2 07/29/21 0813 97 %     Weight --      Height --      Head Circumference --      Peak Flow --      Pain Score 07/29/21 0815 6     Pain Loc --      Pain Edu? --      Excl. in GC? --    No data found.  Updated Vital Signs BP 117/81    Pulse 95    Temp (!) 97.4 F (36.3 C) (Oral)    Resp (!) 22    SpO2 97%   Visual Acuity Right Eye Distance:   Left Eye Distance:   Bilateral Distance:    Right Eye Near:   Left  Eye Near:    Bilateral Near:     Physical Exam Vitals and nursing note reviewed.  Constitutional:      Appearance: He is well-developed.  HENT:     Head: Atraumatic.     Right Ear: External ear normal.     Left Ear: External ear normal.     Nose: Congestion present.     Mouth/Throat:     Mouth: Mucous membranes are moist.     Pharynx: Posterior oropharyngeal erythema present. No oropharyngeal exudate.  Eyes:     Extraocular Movements: Extraocular movements intact.     Pupils: Pupils are equal, round, and reactive to light.     Comments: Bilateral conjunctiva diffusely erythematous, crusting and lash lines  Cardiovascular:     Rate and Rhythm: Normal rate and regular rhythm.     Heart sounds: Normal heart sounds.  Pulmonary:     Effort: Pulmonary effort is normal. No respiratory distress.     Breath sounds: Wheezing present. No rales.     Comments: Mild diffuse wheezes bilaterally Musculoskeletal:        General: Normal range of motion.     Cervical back: Normal range of motion and neck supple.  Lymphadenopathy:     Cervical: No cervical adenopathy.  Skin:    General: Skin is warm and dry.  Neurological:     Mental Status: He is alert and oriented to person, place, and time.     Motor: No weakness.     Gait: Gait normal.  Psychiatric:        Mood and Affect: Mood normal.        Behavior: Behavior normal.        Thought Content: Thought content normal.        Judgment: Judgment normal.   UC Treatments / Results  Labs (all labs ordered are listed, but only abnormal results are displayed) Labs Reviewed - No data to display  EKG   Radiology No results found.  Procedures Procedures (including critical care time)  Medications Ordered in UC Medications - No data to display  Initial Impression / Assessment and Plan / UC Course  I have reviewed the triage vital signs and the nursing notes.  Pertinent labs & imaging results that were available during my care of the  patient were reviewed by me and considered in my medical decision making (  see chart for details).     Progressively worsening symptoms following 2-week history of influenza infection.  We will treat for secondary bacterial sinusitis and asthma exacerbation with azithromycin, prednisone and treat conjunctivitis with Polytrim drops, warm compresses.  It appears that he has been on Advair in the past for asthma, will refill this and recommended he restart his allergy regimen.  Discussed supportive home care and return precautions.  Final Clinical Impressions(s) / UC Diagnoses   Final diagnoses:  Acute non-recurrent maxillary sinusitis  Seasonal allergic rhinitis due to other allergic trigger  Mild persistent asthma with acute exacerbation   Discharge Instructions   None    ED Prescriptions     Medication Sig Dispense Auth. Provider   Fluticasone-Salmeterol (ADVAIR) 250-50 MCG/DOSE AEPB INHALE 1 PUFF BY MOUTH EVERY 12 HOURS 60 each Particia Nearing, PA-C   trimethoprim-polymyxin b (POLYTRIM) ophthalmic solution Place 1 drop into both eyes every 6 (six) hours. 10 mL Particia Nearing, PA-C   azithromycin (ZITHROMAX) 250 MG tablet Take first 2 tablets together, then 1 every day until finished. 6 tablet Particia Nearing, [place]   predniSONE (DELTASONE) 20 MG tablet Take 2 tablets (40 mg total) by mouth daily with breakfast. 10 tablet Particia Nearing, [place]      PDMP not reviewed this encounter.   Roosvelt Maser [place], [place] 07/29/21 [phone]

## 2021-08-27 ENCOUNTER — Other Ambulatory Visit: Payer: Self-pay | Admitting: Family Medicine

## 2021-10-08 ENCOUNTER — Other Ambulatory Visit: Payer: Self-pay

## 2021-10-08 ENCOUNTER — Ambulatory Visit
Admission: EM | Admit: 2021-10-08 | Discharge: 2021-10-08 | Disposition: A | Discharge status: Home / Self Care | Payer: BLUE CROSS/BLUE SHIELD | Attending: Urgent Care | Admitting: Urgent Care

## 2021-10-08 DIAGNOSIS — J4530 Mild persistent asthma, uncomplicated: Secondary | ICD-10-CM

## 2021-10-08 DIAGNOSIS — H9202 Otalgia, left ear: Secondary | ICD-10-CM

## 2021-10-08 DIAGNOSIS — J309 Allergic rhinitis, unspecified: Secondary | ICD-10-CM | POA: Diagnosis not present

## 2021-10-08 DIAGNOSIS — J069 Acute upper respiratory infection, unspecified: Secondary | ICD-10-CM

## 2021-10-08 MED ORDER — PROMETHAZINE-DM 6.25-15 MG/5ML PO SYRP: 5.0000 mL | ORAL_SOLUTION | Freq: Every evening | ORAL | 0 refills | Status: DC | PRN

## 2021-10-08 MED ORDER — PREDNISONE 20 MG PO TABS: ORAL_TABLET | ORAL | 0 refills | Status: DC

## 2021-10-08 MED ORDER — BENZONATATE 100 MG PO CAPS: 100.0000 mg | ORAL_CAPSULE | Freq: Three times a day (TID) | ORAL | 0 refills | Status: DC | PRN

## 2021-10-08 NOTE — ED Triage Notes (Signed)
Pt presents with cough and congestion for past week

## 2021-10-08 NOTE — ED Provider Notes (Signed)
[facility]   MRN: [id number] DOB: 05/31/1991  Subjective:   Travis Aguilar is a 31 y.o. male presenting for 5 to 6-day history of acute onset congestion, sinus pressure, left ear pain, coughing.  Has a history of asthma.  Does not need a refill of his albuterol.  No chest pain, shortness of breath or wheezing.  Had influenza about a month and a half ago.  Does not want a COVID test.  He is not a smoker.  He is also had a sick contact with his son who has been sick around the same time.  No current facility-administered medications for this encounter.  Current Outpatient Medications:    albuterol (PROVENTIL HFA;VENTOLIN HFA) 108 (90 Base) MCG/ACT inhaler, Inhale 1-2 puffs into the lungs every 6 (six) hours as needed for wheezing or shortness of breath., Disp: 1 Inhaler, Rfl: 0   albuterol (PROVENTIL) (2.5 MG/3ML) 0.083% nebulizer solution, Take 3 mLs (2.5 mg total) by nebulization every 6 (six) hours as needed for wheezing or shortness of breath., Disp: 150 mL, Rfl: 0   azithromycin (ZITHROMAX) 250 MG tablet, Take first 2 tablets together, then 1 every day until finished., Disp: 6 tablet, Rfl: 0   Fluticasone-Salmeterol (ADVAIR) 250-50 MCG/DOSE AEPB, INHALE 1 PUFF BY MOUTH EVERY 12 HOURS, Disp: 60 each, Rfl: 0   ibuprofen (ADVIL) 600 MG tablet, Take 1 tablet (600 mg total) by mouth 3 (three) times daily., Disp: 15 tablet, Rfl: 0   ofloxacin (OCUFLOX) 0.3 % ophthalmic solution, Place 1 drop into both eyes 4 (four) times daily., Disp: 5 mL, Rfl: 0   olopatadine (PATADAY) 0.1 % ophthalmic solution, Place 1 drop into both eyes 2 (two) times daily., Disp: 5 mL, Rfl: 12   predniSONE (DELTASONE) 20 MG tablet, Take 2 tablets (40 mg total) by mouth daily with breakfast., Disp: 10 tablet, Rfl: 0   trimethoprim-polymyxin b (POLYTRIM) ophthalmic solution, Place 1 drop into both eyes every 6 (six) hours., Disp: 10 mL, Rfl: 0   Allergies  Allergen Reactions   Amoxil [Amoxicillin] Nausea  And Vomiting   Penicillins Nausea And Vomiting    Past Medical History:  Diagnosis Date   Asthma    Hx of seasonal allergies    Influenza A      Past Surgical History:  Procedure Laterality Date   SURGERY OF LIP     age 89 or 34   TONSILLECTOMY      Family History  Problem Relation Age of Onset   Cancer Other        breast   Healthy Mother    Diabetes Father     Social History   Tobacco Use   Smoking status: Never   Smokeless tobacco: Never  Vaping Use   Vaping Use: Never used  Substance Use Topics   Alcohol use: Not Currently   Drug use: No    ROS   Objective:   Vitals: BP 125/80    Pulse 85    Temp 98.5 F (36.9 C)    Resp 18    SpO2 97%   Physical Exam Constitutional:      General: He is not in acute distress.    Appearance: Normal appearance. He is well-developed and normal weight. He is not ill-appearing, toxic-appearing or diaphoretic.  HENT:     Head: Normocephalic and atraumatic.     Right Ear: Tympanic membrane, ear canal and external ear normal. There is no impacted cerumen.     Left Ear: Tympanic membrane, ear  canal and external ear normal. There is no impacted cerumen.     Nose: Congestion and rhinorrhea present.     Mouth/Throat:     Mouth: Mucous membranes are moist.     Pharynx: No oropharyngeal exudate or posterior oropharyngeal erythema.     Comments: Post-nasal drainage overlying pharynx.  Eyes:     General: No scleral icterus.       Right eye: No discharge.        Left eye: No discharge.     Extraocular Movements: Extraocular movements intact.     Conjunctiva/sclera: Conjunctivae normal.  Cardiovascular:     Rate and Rhythm: Normal rate and regular rhythm.     Heart sounds: Normal heart sounds. No murmur heard.   No friction rub. No gallop.  Pulmonary:     Effort: Pulmonary effort is normal. No respiratory distress.     Breath sounds: Normal breath sounds. No stridor. No wheezing, rhonchi or rales.  Musculoskeletal:      Cervical back: Normal range of motion and neck supple. No rigidity. No muscular tenderness.  Neurological:     General: No focal deficit present.     Mental Status: He is alert and oriented to person, place, and time.  Psychiatric:        Mood and Affect: Mood normal.        Behavior: Behavior normal.        Thought Content: Thought content normal.     Assessment and Plan :   PDMP not reviewed this encounter.  1. Viral URI with cough   2. Left ear pain   3. Allergic rhinitis, unspecified seasonality, unspecified trigger   4. Mild persistent asthma without complication    Deferred imaging given clear cardiopulmonary exam, hemodynamically stable vital signs.  In the context of his asthma and moderate to severe sinus symptoms, recommended oral prednisone course.  Suspect viral URI, viral syndrome. Physical exam findings reassuring and vital signs stable for discharge. Advised supportive care, offered symptomatic relief.  Given timeline of his illness and taking the antibiotic stewardship and consideration we will hold off on antibiotic use for now.  Counseled patient on potential for adverse effects with medications prescribed/recommended today, ER and return-to-clinic precautions discussed, patient verbalized understanding.      Jaynee Eagles, [place] 10/08/21 [phone]

## 2022-04-16 ENCOUNTER — Encounter: Payer: Self-pay | Admitting: Emergency Medicine

## 2022-04-16 ENCOUNTER — Ambulatory Visit
Admission: EM | Admit: 2022-04-16 | Discharge: 2022-04-16 | Disposition: A | Discharge status: Home / Self Care | Payer: BLUE CROSS/BLUE SHIELD | Attending: Family Medicine | Admitting: Family Medicine

## 2022-04-16 DIAGNOSIS — J4521 Mild intermittent asthma with (acute) exacerbation: Secondary | ICD-10-CM | POA: Diagnosis not present

## 2022-04-16 DIAGNOSIS — J069 Acute upper respiratory infection, unspecified: Secondary | ICD-10-CM

## 2022-04-16 MED ORDER — PROMETHAZINE-DM 6.25-15 MG/5ML PO SYRP: 5.0000 mL | ORAL_SOLUTION | Freq: Four times a day (QID) | ORAL | 0 refills | Status: DC | PRN

## 2022-04-16 MED ORDER — PREDNISONE 20 MG PO TABS: 40.0000 mg | ORAL_TABLET | Freq: Every day | ORAL | 0 refills | Status: DC

## 2022-04-16 MED ORDER — FLUTICASONE PROPIONATE 50 MCG/ACT NA SUSP: 1.0000 | Freq: Two times a day (BID) | NASAL | 2 refills | Status: DC

## 2022-04-16 NOTE — ED Triage Notes (Signed)
Sore throat, headache, sneezing, nasal congestion, left ear pain x 5 days.

## 2022-04-17 NOTE — ED Provider Notes (Signed)
[facility]    CSN: [id number] Arrival date & time: 04/16/22  0801      History   Chief Complaint No chief complaint on file.   HPI Travis Aguilar is a 31 y.o. male.   Patient presenting today with 5-day history of sore throat, sneezing, headache, congestion, left ear pain, chest tightness, wheezing.  Denies fever, chills, chest pain, shortness of breath, abdominal pain, nausea vomiting or diarrhea.  So far trying over-the-counter cold and congestion medication with minimal relief.  History of asthma on albuterol as needed.    Past Medical History:  Diagnosis Date   Asthma    Hx of seasonal allergies    Influenza A     Patient Active Problem List   Diagnosis Date Noted   Pars defect of lumbar spine 08/18/2014   Asthma, moderate persistent 06/24/2013    Past Surgical History:  Procedure Laterality Date   SURGERY OF LIP     age 17 or 14   TONSILLECTOMY         Home Medications    Prior to Admission medications   Medication Sig Start Date End Date Taking? Authorizing Provider  fluticasone (FLONASE) 50 MCG/ACT nasal spray Place 1 spray into both nostrils 2 (two) times daily. 04/16/22  Yes Particia Nearing, PA-C  predniSONE (DELTASONE) 20 MG tablet Take 2 tablets (40 mg total) by mouth daily with breakfast. 04/16/22  Yes Particia Nearing, PA-C  promethazine-dextromethorphan (PROMETHAZINE-DM) 6.25-15 MG/5ML syrup Take 5 mLs by mouth 4 (four) times daily as needed. 04/16/22  Yes Particia Nearing, PA-C  albuterol (PROVENTIL HFA;VENTOLIN HFA) 108 (90 Base) MCG/ACT inhaler Inhale 1-2 puffs into the lungs every 6 (six) hours as needed for wheezing or shortness of breath. 10/25/18   Arby Barrette, MD  albuterol (PROVENTIL) (2.5 MG/3ML) 0.083% nebulizer solution Take 3 mLs (2.5 mg total) by nebulization every 6 (six) hours as needed for wheezing or shortness of breath. 05/31/19   Merlyn Albert, MD  azithromycin (ZITHROMAX) 250 MG tablet Take first 2  tablets together, then 1 every day until finished. 07/29/21   Particia Nearing, PA-C  benzonatate (TESSALON) 100 MG capsule Take 1-2 capsules (100-200 mg total) by mouth 3 (three) times daily as needed for cough. 10/08/21   Wallis Bamberg, PA-C  Fluticasone-Salmeterol (ADVAIR) 250-50 MCG/DOSE AEPB INHALE 1 PUFF BY MOUTH EVERY 12 HOURS 07/29/21   Particia Nearing, PA-C  ibuprofen (ADVIL) 600 MG tablet Take 1 tablet (600 mg total) by mouth 3 (three) times daily. 05/28/21   Derwood Kaplan, MD  ofloxacin (OCUFLOX) 0.3 % ophthalmic solution Place 1 drop into both eyes 4 (four) times daily. 05/06/21   Avegno, Zachery Dakins, FNP  olopatadine (PATADAY) 0.1 % ophthalmic solution Place 1 drop into both eyes 2 (two) times daily. 05/06/21   Avegno, Zachery Dakins, FNP  predniSONE (DELTASONE) 20 MG tablet Take 2 tablets daily with breakfast. 10/08/21   Wallis Bamberg, PA-C  promethazine-dextromethorphan (PROMETHAZINE-DM) 6.25-15 MG/5ML syrup Take 5 mLs by mouth at bedtime as needed for cough. 10/08/21   Wallis Bamberg, PA-C  trimethoprim-polymyxin b (POLYTRIM) ophthalmic solution Place 1 drop into both eyes every 6 (six) hours. 07/29/21   Particia Nearing, PA-C    Family History Family History  Problem Relation Age of Onset   Cancer Other        breast   Healthy Mother    Diabetes Father     Social History Social History   Tobacco Use   Smoking  status: Never   Smokeless tobacco: Never  Vaping Use   Vaping Use: Never used  Substance Use Topics   Alcohol use: Not Currently   Drug use: No     Allergies   Amoxil [amoxicillin] and Penicillins   Review of Systems Review of Systems Per HPI  Physical Exam Triage Vital Signs ED Triage Vitals [04/16/22 0812]  Enc Vitals Group     BP 139/78     Pulse Rate 95     Resp 16     Temp 98.7 F (37.1 C)     Temp Source Oral     SpO2 96 %     Weight      Height      Head Circumference      Peak Flow      Pain Score 8     Pain Loc      Pain  Edu?      Excl. in [place]?    No data found.  Updated Vital Signs BP 139/78 (BP Location: Right Arm)   Pulse 95   Temp 98.7 F (37.1 C) (Oral)   Resp 16   SpO2 96%   Visual Acuity Right Eye Distance:   Left Eye Distance:   Bilateral Distance:    Right Eye Near:   Left Eye Near:    Bilateral Near:     Physical Exam Vitals and nursing note reviewed.  Constitutional:      Appearance: He is well-developed.  HENT:     Head: Atraumatic.     Right Ear: External ear normal.     Left Ear: External ear normal.     Ears:     Comments: Bilateral middle ear effusions    Nose: Rhinorrhea present.     Mouth/Throat:     Pharynx: Posterior oropharyngeal erythema present. No oropharyngeal exudate.  Eyes:     Conjunctiva/sclera: Conjunctivae normal.     Pupils: Pupils are equal, round, and reactive to light.  Cardiovascular:     Rate and Rhythm: Normal rate and regular rhythm.  Pulmonary:     Effort: Pulmonary effort is normal. No respiratory distress.     Breath sounds: Wheezing present. No rales.  Musculoskeletal:        General: Normal range of motion.     Cervical back: Normal range of motion and neck supple.  Lymphadenopathy:     Cervical: No cervical adenopathy.  Skin:    General: Skin is warm and dry.  Neurological:     Mental Status: He is alert and oriented to person, place, and time.  Psychiatric:        Behavior: Behavior normal.      UC Treatments / Results  Labs (all labs ordered are listed, but only abnormal results are displayed) Labs Reviewed - No data to display  EKG   Radiology No results found.  Procedures Procedures (including critical care time)  Medications Ordered in UC Medications - No data to display  Initial Impression / Assessment and Plan / UC Course  I have reviewed the triage vital signs and the nursing notes.  Pertinent labs & imaging results that were available during my care of the patient were reviewed by me and considered in my  medical decision making (see chart for details).     Declines respiratory testing today given duration of symptoms, will treat with prednisone, Phenergan DM, Flonase nasal spray for suspected viral upper respiratory infection causing asthma exacerbation.  Return for worsening symptoms.  Final  Clinical Impressions(s) / UC Diagnoses   Final diagnoses:  Viral URI with cough  Mild intermittent asthma with acute exacerbation   Discharge Instructions   None    ED Prescriptions     Medication Sig Dispense Auth. Provider   predniSONE (DELTASONE) 20 MG tablet Take 2 tablets (40 mg total) by mouth daily with breakfast. 10 tablet Particia Nearing, PA-C   promethazine-dextromethorphan (PROMETHAZINE-DM) 6.25-15 MG/5ML syrup Take 5 mLs by mouth 4 (four) times daily as needed. 100 mL Particia Nearing, PA-C   fluticasone [facility]) 50 MCG/ACT nasal spray Place 1 spray into both nostrils 2 (two) times daily. 16 g Particia Nearing, [place]      PDMP not reviewed this encounter.   Particia Nearing, [place] 04/17/22 1924

## 2022-10-07 ENCOUNTER — Ambulatory Visit
Admission: EM | Admit: 2022-10-07 | Discharge: 2022-10-07 | Disposition: A | Discharge status: Home / Self Care | Payer: BC Managed Care – PPO | Attending: Nurse Practitioner | Admitting: Nurse Practitioner

## 2022-10-07 DIAGNOSIS — J45901 Unspecified asthma with (acute) exacerbation: Secondary | ICD-10-CM | POA: Insufficient documentation

## 2022-10-07 DIAGNOSIS — J069 Acute upper respiratory infection, unspecified: Secondary | ICD-10-CM | POA: Insufficient documentation

## 2022-10-07 DIAGNOSIS — Z1152 Encounter for screening for COVID-19: Secondary | ICD-10-CM | POA: Insufficient documentation

## 2022-10-07 DIAGNOSIS — J029 Acute pharyngitis, unspecified: Secondary | ICD-10-CM | POA: Diagnosis present

## 2022-10-07 LAB — POCT RAPID STREP A (OFFICE): Rapid Strep A Screen: NEGATIVE

## 2022-10-07 MED ORDER — ALBUTEROL SULFATE (2.5 MG/3ML) 0.083% IN NEBU: 2.5000 mg | INHALATION_SOLUTION | Freq: Four times a day (QID) | RESPIRATORY_TRACT | 0 refills | Status: DC | PRN

## 2022-10-07 MED ORDER — PROMETHAZINE-DM 6.25-15 MG/5ML PO SYRP: 5.0000 mL | ORAL_SOLUTION | Freq: Four times a day (QID) | ORAL | 0 refills | Status: DC | PRN

## 2022-10-07 MED ORDER — MONTELUKAST SODIUM 10 MG PO TABS: 10.0000 mg | ORAL_TABLET | Freq: Every day | ORAL | 0 refills | Status: DC

## 2022-10-07 MED ORDER — PREDNISONE 20 MG PO TABS: 40.0000 mg | ORAL_TABLET | Freq: Every day | ORAL | 0 refills | Status: AC

## 2022-10-07 NOTE — ED Provider Notes (Signed)
[facility]    CSN: [id number] Arrival date & time: 10/07/22  0802      History   Chief Complaint Chief Complaint  Patient presents with   Cough   Sore Throat    HPI Travis Aguilar is a 32 y.o. male.   The history is provided by the patient.   The patient presents for complaints of sore throat, cough, nasal congestion, runny nose, shortness of breath, wheezing that started over the last several days.  Patient states that he has not had fever, chills, ear pain, headache, abdominal pain, nausea or diarrhea.  Patient states he is coughing up a lot of mucus to the point where he vomits mucus.  He does have a history of asthma and has been using his albuterol inhaler, and nebulizer machine.  He states that he has also been using over-the-counter cough and cold medication for his symptoms.  Past Medical History:  Diagnosis Date   Asthma    Hx of seasonal allergies    Influenza A     Patient Active Problem List   Diagnosis Date Noted   Pars defect of lumbar spine 08/18/2014   Asthma, moderate persistent 06/24/2013    Past Surgical History:  Procedure Laterality Date   SURGERY OF LIP     age 22 or 40   TONSILLECTOMY         Home Medications    Prior to Admission medications   Medication Sig Start Date End Date Taking? Authorizing Provider  albuterol (PROVENTIL) (2.5 MG/3ML) 0.083% nebulizer solution Take 3 mLs (2.5 mg total) by nebulization every 6 (six) hours as needed for wheezing or shortness of breath. 10/07/22  Yes Shivonne Schwartzman-Warren, Alda Lea, NP  Fluticasone-Salmeterol (ADVAIR) 250-50 MCG/DOSE AEPB INHALE 1 PUFF BY MOUTH EVERY 12 HOURS 07/29/21  Yes Volney American, PA-C  montelukast (SINGULAIR) 10 MG tablet Take 1 tablet (10 mg total) by mouth at bedtime. 10/07/22  Yes Lauralee Waters-Warren, Alda Lea, NP  predniSONE (DELTASONE) 20 MG tablet Take 2 tablets (40 mg total) by mouth daily with breakfast for 5 days. 10/07/22 10/12/22 Yes Meiling Hendriks-Warren, Alda Lea, NP   promethazine-dextromethorphan (PROMETHAZINE-DM) 6.25-15 MG/5ML syrup Take 5 mLs by mouth 4 (four) times daily as needed for cough. 10/07/22  Yes Kyrese Gartman-Warren, Alda Lea, NP  azithromycin (ZITHROMAX) 250 MG tablet Take first 2 tablets together, then 1 every day until finished. 07/29/21   Volney American, PA-C  benzonatate (TESSALON) 100 MG capsule Take 1-2 capsules (100-200 mg total) by mouth 3 (three) times daily as needed for cough. 10/08/21   Jaynee Eagles, PA-C  fluticasone (FLONASE) 50 MCG/ACT nasal spray Place 1 spray into both nostrils 2 (two) times daily. 04/16/22   Volney American, PA-C  ibuprofen (ADVIL) 600 MG tablet Take 1 tablet (600 mg total) by mouth 3 (three) times daily. 05/28/21   Varney Biles, MD  ofloxacin (OCUFLOX) 0.3 % ophthalmic solution Place 1 drop into both eyes 4 (four) times daily. 05/06/21   Avegno, Darrelyn Hillock, FNP  olopatadine (PATADAY) 0.1 % ophthalmic solution Place 1 drop into both eyes 2 (two) times daily. 05/06/21   Avegno, Darrelyn Hillock, FNP  trimethoprim-polymyxin b (POLYTRIM) ophthalmic solution Place 1 drop into both eyes every 6 (six) hours. 07/29/21   Volney American, PA-C    Family History Family History  Problem Relation Age of Onset   Cancer Other        breast   Healthy Mother    Diabetes Father  Social History Social History   Tobacco Use   Smoking status: Never   Smokeless tobacco: Never  Vaping Use   Vaping Use: Never used  Substance Use Topics   Alcohol use: Not Currently   Drug use: No     Allergies   Amoxil [amoxicillin] and Penicillins   Review of Systems Review of Systems Per HPI  Physical Exam Triage Vital Signs ED Triage Vitals  Enc Vitals Group     BP 10/07/22 0817 137/83     Pulse Rate 10/07/22 0817 (!) 102     Resp 10/07/22 0817 17     Temp 10/07/22 0817 98.1 F (36.7 C)     Temp Source 10/07/22 0817 Oral     SpO2 10/07/22 0817 98 %     Weight --      Height --      Head Circumference --       Peak Flow --      Pain Score 10/07/22 0825 6     Pain Loc --      Pain Edu? --      Excl. in [place]? --    No data found.  Updated Vital Signs BP 137/83 (BP Location: Right Arm)   Pulse (!) 102   Temp 98.1 F (36.7 C) (Oral)   Resp 17   SpO2 98%   Visual Acuity Right Eye Distance:   Left Eye Distance:   Bilateral Distance:    Right Eye Near:   Left Eye Near:    Bilateral Near:     Physical Exam Vitals and nursing note reviewed.  Constitutional:      General: He is not in acute distress.    Appearance: He is well-developed.  HENT:     Head: Normocephalic.     Right Ear: Tympanic membrane and ear canal normal.     Left Ear: Tympanic membrane and ear canal normal.     Nose: Congestion present. No rhinorrhea.     Mouth/Throat:     Pharynx: Pharyngeal swelling and posterior oropharyngeal erythema present.     Tonsils: 1+ on the right. 1+ on the left.  Eyes:     Conjunctiva/sclera: Conjunctivae normal.     Pupils: Pupils are equal, round, and reactive to light.  Cardiovascular:     Rate and Rhythm: Regular rhythm. Tachycardia present.     Heart sounds: Normal heart sounds.  Pulmonary:     Effort: Pulmonary effort is normal. No respiratory distress.     Breath sounds: Normal breath sounds. No stridor. No wheezing, rhonchi or rales.  Abdominal:     General: Bowel sounds are normal.     Palpations: Abdomen is soft.     Tenderness: There is no abdominal tenderness.  Musculoskeletal:     Cervical back: Normal range of motion.  Lymphadenopathy:     Cervical: No cervical adenopathy.  Skin:    General: Skin is warm and dry.  Neurological:     General: No focal deficit present.     Mental Status: He is alert and oriented to person, place, and time.  Psychiatric:        Mood and Affect: Mood normal.        Behavior: Behavior normal.      UC Treatments / Results  Labs (all labs ordered are listed, but only abnormal results are displayed) Labs Reviewed  SARS  CORONAVIRUS 2 (TAT 6-24 HRS)  CULTURE, GROUP A STREP [facility])  POCT RAPID STREP A (OFFICE)  EKG   Radiology No results found.  Procedures Procedures (including critical care time)  Medications Ordered in UC Medications - No data to display  Initial Impression / Assessment and Plan / UC Course  I have reviewed the triage vital signs and the nursing notes.  Pertinent labs & imaging results that were available during my care of the patient were reviewed by me and considered in my medical decision making (see chart for details).  The patient is well-appearing, he is in no acute distress, vital signs are stable.    On exam, lung sounds are clear, no wheezing, rales, or rhonchi noted.  The rapid strep test was negative, a throat culture and COVID test are pending.  Patient is a candidate to receive Paxlovid if his COVID test is positive.  Symptoms appear to be consistent with a viral upper respiratory infection with cough, which is most likely exacerbating his underlying history of asthma.  Will start patient on prednisone 40 mg for the next 5 days for acute asthma exacerbation, Promethazine DM for his cough, will also provide a refill for his albuterol nebulizer solution for wheezing or shortness of breath.  Will start patient on Singulair to help provide better control of his asthma and allergy symptoms at this time.  Supportive care recommendations were provided to the patient, along with strict ER follow-up precautions. Patient is in agreement with this plan of care and verbalizes understanding.  All questions were answered.  Patient stable for discharge.  Final Clinical Impressions(s) / UC Diagnoses   Final diagnoses:  Encounter for screening for COVID-19  Asthma with acute exacerbation, unspecified asthma severity, unspecified whether persistent  Viral upper respiratory tract infection with cough  Sore throat     Discharge Instructions      The rapid strep test was negative, a  throat culture and COVID test are pending.  You are a candidate to receive Paxlovid if your COVID test is positive.  You will be contacted if the results of your pending test are positive. Take medication as prescribed. Increase fluids and allow for plenty of rest. May take over-the-counter Tylenol or ibuprofen as needed for pain, fever, or general discomfort. Recommend use of a humidifier in your bedroom at nighttime during sleep and sleeping elevated on pillows while cough symptoms persist. Go to the emergency department if you develop worsening shortness of breath, difficulty breathing, or wheezing. Follow-up as needed.     ED Prescriptions     Medication Sig Dispense Auth. Provider   predniSONE (DELTASONE) 20 MG tablet Take 2 tablets (40 mg total) by mouth daily with breakfast for 5 days. 10 tablet Saavi Mceachron-Warren, Alda Lea, NP   albuterol (PROVENTIL) (2.5 MG/3ML) 0.083% nebulizer solution Take 3 mLs (2.5 mg total) by nebulization every 6 (six) hours as needed for wheezing or shortness of breath. 75 mL Deondra Labrador-Warren, Alda Lea, NP   montelukast (SINGULAIR) 10 MG tablet Take 1 tablet (10 mg total) by mouth at bedtime. 30 tablet Lalitha Ilyas-Warren, Alda Lea, NP   promethazine-dextromethorphan (PROMETHAZINE-DM) 6.25-15 MG/5ML syrup Take 5 mLs by mouth 4 (four) times daily as needed for cough. 118 mL Kendale Rembold-Warren, Alda Lea, NP      PDMP not reviewed this encounter.   Tish Men, NP 10/07/22 [phone]

## 2022-10-07 NOTE — ED Triage Notes (Signed)
Sore throat that started Wednesday, cough with yellow mucus, SOB, wheezing, congestion, that started Saturday. Using inhaler, nebulizer Q4 hours, and OTC cough medication.

## 2022-10-07 NOTE — Discharge Instructions (Addendum)
The rapid strep test was negative, a throat culture and COVID test are pending.  You are a candidate to receive Paxlovid if your COVID test is positive.  You will be contacted if the results of your pending test are positive. Take medication as prescribed. Increase fluids and allow for plenty of rest. May take over-the-counter Tylenol or ibuprofen as needed for pain, fever, or general discomfort. Recommend use of a humidifier in your bedroom at nighttime during sleep and sleeping elevated on pillows while cough symptoms persist. Go to the emergency department if you develop worsening shortness of breath, difficulty breathing, or wheezing. Follow-up as needed.

## 2022-10-08 LAB — SARS CORONAVIRUS 2 (TAT 6-24 HRS): SARS Coronavirus 2: NEGATIVE

## 2022-10-10 LAB — CULTURE, GROUP A STREP (THRC)

## 2023-04-23 ENCOUNTER — Ambulatory Visit
Admission: EM | Admit: 2023-04-23 | Discharge: 2023-04-23 | Disposition: A | Discharge status: Home / Self Care | Payer: BC Managed Care – PPO | Attending: Family Medicine | Admitting: Family Medicine

## 2023-04-23 ENCOUNTER — Encounter: Payer: Self-pay | Admitting: Emergency Medicine

## 2023-04-23 DIAGNOSIS — J4541 Moderate persistent asthma with (acute) exacerbation: Secondary | ICD-10-CM | POA: Diagnosis not present

## 2023-04-23 DIAGNOSIS — J069 Acute upper respiratory infection, unspecified: Secondary | ICD-10-CM

## 2023-04-23 MED ORDER — FLUTICASONE-SALMETEROL 250-50 MCG/ACT IN AEPB: 1.0000 | INHALATION_SPRAY | Freq: Two times a day (BID) | RESPIRATORY_TRACT | 1 refills | Status: AC

## 2023-04-23 MED ORDER — ALBUTEROL SULFATE (2.5 MG/3ML) 0.083% IN NEBU: 2.5000 mg | INHALATION_SOLUTION | RESPIRATORY_TRACT | 1 refills | Status: DC | PRN

## 2023-04-23 MED ORDER — PROMETHAZINE-DM 6.25-15 MG/5ML PO SYRP: 5.0000 mL | ORAL_SOLUTION | Freq: Four times a day (QID) | ORAL | 0 refills | Status: AC | PRN

## 2023-04-23 MED ORDER — PREDNISONE 20 MG PO TABS: 40.0000 mg | ORAL_TABLET | Freq: Every day | ORAL | 0 refills | Status: AC

## 2023-04-23 MED ORDER — ALBUTEROL SULFATE HFA 108 (90 BASE) MCG/ACT IN AERS: 2.0000 | INHALATION_SPRAY | RESPIRATORY_TRACT | 0 refills | Status: DC | PRN

## 2023-04-23 NOTE — ED Provider Notes (Signed)
[facility]    CSN: [id number] Arrival date & time: 04/23/23  6314      History   Chief Complaint No chief complaint on file.   HPI Travis Aguilar is a 32 y.o. male.   Patient presenting today with 1 week history of cough, congestion, sore throat that is now mainly resolved apart from wheezing, chest tightness, shortness of breath from running out of his albuterol inhalers.  Denies fever, chills, chest pain, abdominal pain, nausea vomiting or diarrhea.  States he used to be on Advair about a year ago and is wanting to restart that and needs an albuterol inhaler and neb solution refilled.    Past Medical History:  Diagnosis Date   Asthma    Hx of seasonal allergies    Influenza A     Patient Active Problem List   Diagnosis Date Noted   Pars defect of lumbar spine 08/18/2014   Asthma, moderate persistent 06/24/2013    Past Surgical History:  Procedure Laterality Date   SURGERY OF LIP     age 36 or 72   TONSILLECTOMY         Home Medications    Prior to Admission medications   Medication Sig Start Date End Date Taking? Authorizing Provider  albuterol (VENTOLIN HFA) 108 (90 Base) MCG/ACT inhaler Inhale 2 puffs into the lungs every 4 (four) hours as needed for wheezing or shortness of breath. 04/23/23  Yes Particia Nearing, PA-C  fluticasone-salmeterol (ADVAIR DISKUS) 250-50 MCG/ACT AEPB Inhale 1 puff into the lungs in the morning and at bedtime. 04/23/23  Yes Particia Nearing, PA-C  predniSONE (DELTASONE) 20 MG tablet Take 2 tablets (40 mg total) by mouth daily with breakfast. 04/23/23  Yes Particia Nearing, PA-C  promethazine-dextromethorphan (PROMETHAZINE-DM) 6.25-15 MG/5ML syrup Take 5 mLs by mouth 4 (four) times daily as needed. 04/23/23  Yes Particia Nearing, PA-C  albuterol (PROVENTIL) (2.5 MG/3ML) 0.083% nebulizer solution Take 3 mLs (2.5 mg total) by nebulization every 4 (four) hours as needed for wheezing or shortness of breath.  04/23/23   Particia Nearing, PA-C  ibuprofen (ADVIL) 600 MG tablet Take 1 tablet (600 mg total) by mouth 3 (three) times daily. 05/28/21   Derwood Kaplan, MD    Family History Family History  Problem Relation Age of Onset   Cancer Other        breast   Healthy Mother    Diabetes Father     Social History Social History   Tobacco Use   Smoking status: Never   Smokeless tobacco: Never  Vaping Use   Vaping status: Never Used  Substance Use Topics   Alcohol use: Not Currently   Drug use: No     Allergies   Amoxil [amoxicillin] and Penicillins   Review of Systems Review of Systems Per HPI  Physical Exam Triage Vital Signs ED Triage Vitals  Encounter Vitals Group     BP 04/23/23 0850 129/77     Systolic BP Percentile --      Diastolic BP Percentile --      Pulse Rate 04/23/23 0850 66     Resp 04/23/23 0850 18     Temp 04/23/23 0850 98 F (36.7 C)     Temp Source 04/23/23 0850 Oral     SpO2 04/23/23 0850 98 %     Weight --      Height --      Head Circumference --      Peak Flow --  Pain Score 04/23/23 0851 0     Pain Loc --      Pain Education --      Exclude from Growth Chart --    No data found.  Updated Vital Signs BP 129/77 (BP Location: Right Arm)   Pulse 66   Temp 98 F (36.7 C) (Oral)   Resp 18   SpO2 98%   Visual Acuity Right Eye Distance:   Left Eye Distance:   Bilateral Distance:    Right Eye Near:   Left Eye Near:    Bilateral Near:     Physical Exam Vitals and nursing note reviewed.  Constitutional:      Appearance: He is well-developed.  HENT:     Head: Atraumatic.     Right Ear: External ear normal.     Left Ear: External ear normal.     Nose: Rhinorrhea present.     Mouth/Throat:     Mouth: Mucous membranes are moist.     Pharynx: Oropharynx is clear. No oropharyngeal exudate.  Eyes:     Conjunctiva/sclera: Conjunctivae normal.     Pupils: Pupils are equal, round, and reactive to light.  Cardiovascular:      Rate and Rhythm: Normal rate and regular rhythm.  Pulmonary:     Effort: Pulmonary effort is normal. No respiratory distress.     Breath sounds: Wheezing present. No rales.  Musculoskeletal:        General: Normal range of motion.     Cervical back: Normal range of motion and neck supple.  Lymphadenopathy:     Cervical: No cervical adenopathy.  Skin:    General: Skin is warm and dry.  Neurological:     Mental Status: He is alert and oriented to person, place, and time.  Psychiatric:        Behavior: Behavior normal.      UC Treatments / Results  Labs (all labs ordered are listed, but only abnormal results are displayed) Labs Reviewed - No data to display  EKG   Radiology No results found.  Procedures Procedures (including critical care time)  Medications Ordered in UC Medications - No data to display  Initial Impression / Assessment and Plan / UC Course  I have reviewed the triage vital signs and the nursing notes.  Pertinent labs & imaging results that were available during my care of the patient were reviewed by me and considered in my medical decision making (see chart for details).     Suspect viral upper respiratory infection causing asthma exacerbation.  Vitals and exam overall reassuring today and he is in no acute distress.  Will refill albuterol neb solution, inhaler, restart Advair per his request and treat exacerbation with prednisone, Phenergan DM.  Discussed supportive home care and return precautions.  Final Clinical Impressions(s) / UC Diagnoses   Final diagnoses:  Viral URI  Moderate persistent asthma with acute exacerbation   Discharge Instructions   None    ED Prescriptions     Medication Sig Dispense Auth. Provider   albuterol (PROVENTIL) (2.5 MG/3ML) 0.083% nebulizer solution Take 3 mLs (2.5 mg total) by nebulization every 4 (four) hours as needed for wheezing or shortness of breath. 75 mL Particia Nearing, PA-C   albuterol  (VENTOLIN HFA) 108 (90 Base) MCG/ACT inhaler Inhale 2 puffs into the lungs every 4 (four) hours as needed for wheezing or shortness of breath. 18 g Roosvelt Maser [place], [place]   promethazine-dextromethorphan (PROMETHAZINE-DM) 6.25-15 MG/5ML syrup Take 5 mLs by  mouth 4 (four) times daily as needed. 100 mL Particia Nearing, PA-C   fluticasone-salmeterol (ADVAIR DISKUS) 250-50 MCG/ACT AEPB Inhale 1 puff into the lungs in the morning and at bedtime. 60 each Particia Nearing, PA-C   predniSONE (DELTASONE) 20 MG tablet Take 2 tablets (40 mg total) by mouth daily with breakfast. 10 tablet Particia Nearing, [place]      PDMP not reviewed this encounter.   Roosvelt Maser [place], [place] 04/23/23 [phone]

## 2023-04-23 NOTE — ED Triage Notes (Signed)
Cough and congestion x 1 week.  States he has been wheezing and ran out of this nebulizer treatments.

## 2023-05-06 ENCOUNTER — Other Ambulatory Visit: Payer: Self-pay | Admitting: Family Medicine

## 2023-05-06 NOTE — Telephone Encounter (Signed)
Requested Prescriptions  Pending Prescriptions Disp Refills   albuterol (VENTOLIN HFA) 108 (90 Base) MCG/ACT inhaler [Pharmacy Med Name: ALBUTEROL HFA INH (200 PUFFS) 6.7GM] 6.7 g     Sig: INHALE 2 PUFFS INTO THE LUNGS EVERY 4 HOURS AS NEEDED FOR WHEEZING OR SHORTNESS OF BREATH     Pulmonology:  Beta Agonists 2 Failed - 05/06/2023  3:33 AM      Failed - Valid encounter within last 12 months    Recent Outpatient Visits   None            Passed - Last BP in normal range    BP Readings from Last 1 Encounters:  04/23/23 129/77         Passed - Last Heart Rate in normal range    Pulse Readings from Last 1 Encounters:  04/23/23 66

## 2023-06-16 ENCOUNTER — Other Ambulatory Visit: Payer: Self-pay | Admitting: Family Medicine

## 2023-08-01 ENCOUNTER — Ambulatory Visit
Admission: EM | Admit: 2023-08-01 | Discharge: 2023-08-01 | Disposition: A | Discharge status: Home / Self Care | Payer: BC Managed Care – PPO | Attending: Nurse Practitioner | Admitting: Nurse Practitioner

## 2023-08-01 DIAGNOSIS — Z8709 Personal history of other diseases of the respiratory system: Secondary | ICD-10-CM | POA: Diagnosis present

## 2023-08-01 DIAGNOSIS — J069 Acute upper respiratory infection, unspecified: Secondary | ICD-10-CM | POA: Insufficient documentation

## 2023-08-01 DIAGNOSIS — J029 Acute pharyngitis, unspecified: Secondary | ICD-10-CM | POA: Diagnosis present

## 2023-08-01 DIAGNOSIS — Z1152 Encounter for screening for COVID-19: Secondary | ICD-10-CM | POA: Diagnosis present

## 2023-08-01 LAB — POCT RAPID STREP A (OFFICE): Rapid Strep A Screen: NEGATIVE

## 2023-08-01 MED ORDER — ALBUTEROL SULFATE (2.5 MG/3ML) 0.083% IN NEBU: 2.5000 mg | INHALATION_SOLUTION | Freq: Four times a day (QID) | RESPIRATORY_TRACT | 12 refills | Status: AC | PRN

## 2023-08-01 MED ORDER — NYSTATIN 100000 UNIT/ML MT SUSP: 10.0000 mL | Freq: Four times a day (QID) | OROMUCOSAL | 0 refills | Status: AC | PRN

## 2023-08-01 MED ORDER — FLUTICASONE PROPIONATE 50 MCG/ACT NA SUSP: 2.0000 | Freq: Every day | NASAL | 0 refills | Status: AC

## 2023-08-01 NOTE — ED Provider Notes (Signed)
[facility]    CSN: [id number] Arrival date & time: 08/01/23  1655      History   Chief Complaint Chief Complaint  Patient presents with   Sore Throat    HPI Travis Aguilar is a 32 y.o. male.   The history is provided by the patient.   Patient presents for complaints of sore throat, nasal congestion, fever, and bilateral ear pain and pressure that started over the past several days.  States he did not check his fever at home but that he felt hot.  Denies headache, shortness of breath, difficulty breathing, chest pain, abdominal pain, nausea, vomiting, diarrhea, or rash.  Patient with underlying history of asthma.  Reports he has been using his inhaler at home.  He has not been taking any medication for his symptoms.  Denies any obvious known sick contacts.  Past Medical History:  Diagnosis Date   Asthma    Hx of seasonal allergies    Influenza A     Patient Active Problem List   Diagnosis Date Noted   Pars defect of lumbar spine 08/18/2014   Asthma, moderate persistent 06/24/2013    Past Surgical History:  Procedure Laterality Date   SURGERY OF LIP     age 6 or 28   TONSILLECTOMY         Home Medications    Prior to Admission medications   Medication Sig Start Date End Date Taking? Authorizing Provider  albuterol (PROVENTIL) (2.5 MG/3ML) 0.083% nebulizer solution Take 3 mLs (2.5 mg total) by nebulization every 6 (six) hours as needed for wheezing or shortness of breath. 08/01/23  Yes Leath-Warren, Sadie Haber, NP  fluticasone (FLONASE) 50 MCG/ACT nasal spray Place 2 sprays into both nostrils daily. 08/01/23  Yes Leath-Warren, Sadie Haber, NP  magic mouthwash (nystatin, hydrocortisone, diphenhydrAMINE, lidocaine) suspension Swish and swallow 10 mLs 4 (four) times daily as needed for mouth pain. 08/01/23  Yes Leath-Warren, Sadie Haber, NP  fluticasone-salmeterol (ADVAIR DISKUS) 250-50 MCG/ACT AEPB Inhale 1 puff into the lungs in the morning and at bedtime.  04/23/23   Particia Nearing, PA-C  ibuprofen (ADVIL) 600 MG tablet Take 1 tablet (600 mg total) by mouth 3 (three) times daily. 05/28/21   Derwood Kaplan, MD  predniSONE (DELTASONE) 20 MG tablet Take 2 tablets (40 mg total) by mouth daily with breakfast. 04/23/23   Particia Nearing, PA-C  promethazine-dextromethorphan (PROMETHAZINE-DM) 6.25-15 MG/5ML syrup Take 5 mLs by mouth 4 (four) times daily as needed. 04/23/23   Particia Nearing, PA-C    Family History Family History  Problem Relation Age of Onset   Cancer Other        breast   Healthy Mother    Diabetes Father     Social History Social History   Tobacco Use   Smoking status: Never   Smokeless tobacco: Never  Vaping Use   Vaping status: Never Used  Substance Use Topics   Alcohol use: Not Currently   Drug use: No     Allergies   Amoxil [amoxicillin] and Penicillins   Review of Systems Review of Systems Per HPI  Physical Exam Triage Vital Signs ED Triage Vitals [08/01/23 1739]  Encounter Vitals Group     BP 133/81     Systolic BP Percentile      Diastolic BP Percentile      Pulse Rate 67     Resp 16     Temp 97.9 F (36.6 C)     Temp Source  Oral     SpO2 98 %     Weight      Height      Head Circumference      Peak Flow      Pain Score 10     Pain Loc      Pain Education      Exclude from Growth Chart    No data found.  Updated Vital Signs BP 133/81 (BP Location: Right Arm)   Pulse 67   Temp 97.9 F (36.6 C) (Oral)   Resp 16   SpO2 98%   Visual Acuity Right Eye Distance:   Left Eye Distance:   Bilateral Distance:    Right Eye Near:   Left Eye Near:    Bilateral Near:     Physical Exam Vitals and nursing note reviewed.  Constitutional:      General: He is not in acute distress.    Appearance: He is well-developed.  HENT:     Head: Normocephalic.     Right Ear: Ear canal normal. A middle ear effusion is present.     Left Ear: Ear canal normal. A middle ear  effusion is present.     Nose: Congestion present.     Right Turbinates: Enlarged and swollen.     Left Turbinates: Enlarged and swollen.     Right Sinus: No maxillary sinus tenderness or frontal sinus tenderness.     Left Sinus: No maxillary sinus tenderness or frontal sinus tenderness.     Mouth/Throat:     Lips: Pink.     Mouth: Mucous membranes are moist.     Pharynx: Pharyngeal swelling and posterior oropharyngeal erythema present. No oropharyngeal exudate or uvula swelling.     Tonsils: No tonsillar exudate or tonsillar abscesses. 1+ on the right. 1+ on the left.  Eyes:     Conjunctiva/sclera: Conjunctivae normal.     Pupils: Pupils are equal, round, and reactive to light.  Cardiovascular:     Rate and Rhythm: Normal rate and regular rhythm.     Pulses: Normal pulses.     Heart sounds: Normal heart sounds.  Pulmonary:     Effort: Pulmonary effort is normal. No respiratory distress.     Breath sounds: Normal breath sounds. No stridor. No wheezing, rhonchi or rales.  Abdominal:     General: Bowel sounds are normal.     Palpations: Abdomen is soft.  Musculoskeletal:     Cervical back: Normal range of motion.  Lymphadenopathy:     Cervical: No cervical adenopathy.  Skin:    General: Skin is warm and dry.  Neurological:     General: No focal deficit present.     Mental Status: He is alert and oriented to person, place, and time.  Psychiatric:        Mood and Affect: Mood normal.        Behavior: Behavior normal.      UC Treatments / Results  Labs (all labs ordered are listed, but only abnormal results are displayed) Labs Reviewed  CULTURE, GROUP A STREP ([facility])  SARS CORONAVIRUS 2 (TAT 6-24 HRS)  POCT RAPID STREP A (OFFICE)    EKG   Radiology No results found.  Procedures Procedures (including critical care time)  Medications Ordered in UC Medications - No data to display  Initial Impression / Assessment and Plan / UC Course  I have reviewed the triage  vital signs and the nursing notes.  Pertinent labs & imaging results that were available  during my care of the patient were reviewed by me and considered in my medical decision making (see chart for details).  Rapid strep test is negative, COVID test and throat culture are pending.  Suspect viral URI at this time.  Will provide symptomatic treatment for his nasal congestion with fluticasone 50 mcg nasal spray, and for throat pain, Magic mouthwash was prescribed.  Refill provided for patient's albuterol nebulizer.  Supportive care recommendations were provided and discussed with the patient to include fluids, rest, over-the-counter analgesics, normal saline nasal spray, and warm salt water gargles.  Discussed indications for follow-up with the patient.  Patient was in agreement with this plan of care and verbalized understanding.  All questions were answered.  Patient stable for discharge.  Final Clinical Impressions(s) / UC Diagnoses   Final diagnoses:  Acute pharyngitis, unspecified etiology  Viral URI  Encounter for screening for COVID-19  History of asthma     Discharge Instructions      The rapid strep test was negative.  A throat culture and COVID test are pending.  You will be contacted if the pending test results are abnormal.  You also have access to results via MyChart. Take medication as prescribed. Increase fluids and allow for plenty of rest. May take over-the-counter Tylenol or ibuprofen as needed for pain, fever, or general discomfort. Warm salt water gargles 3-4 times daily as needed for throat pain or discomfort. Also recommend Chloraseptic throat spray and throat lozenges for throat pain or discomfort. Recommend a soft diet to include soup, broth, yogurt, pudding, Jell-O, or popsicles while symptoms persist. If symptoms have not improved over the next 7 to 10 days, or worsen, you may follow-up in this clinic or with your primary care physician for further  evaluation. Follow-up as needed.      ED Prescriptions     Medication Sig Dispense Auth. Provider   magic mouthwash (nystatin, hydrocortisone, diphenhydrAMINE, lidocaine) suspension Swish and swallow 10 mLs 4 (four) times daily as needed for mouth pain. 540 mL Leath-Warren, Sadie Haber, NP   albuterol (PROVENTIL) (2.5 MG/3ML) 0.083% nebulizer solution Take 3 mLs (2.5 mg total) by nebulization every 6 (six) hours as needed for wheezing or shortness of breath. 75 mL Leath-Warren, Sadie Haber, NP   fluticasone (FLONASE) 50 MCG/ACT nasal spray Place 2 sprays into both nostrils daily. 16 g Leath-Warren, Sadie Haber, NP      PDMP not reviewed this encounter.   Abran Cantor, NP 08/01/23 1850

## 2023-08-01 NOTE — Discharge Instructions (Addendum)
The rapid strep test was negative.  A throat culture and COVID test are pending.  You will be contacted if the pending test results are abnormal.  You also have access to results via MyChart. Take medication as prescribed. Increase fluids and allow for plenty of rest. May take over-the-counter Tylenol or ibuprofen as needed for pain, fever, or general discomfort. Warm salt water gargles 3-4 times daily as needed for throat pain or discomfort. Also recommend Chloraseptic throat spray and throat lozenges for throat pain or discomfort. Recommend a soft diet to include soup, broth, yogurt, pudding, Jell-O, or popsicles while symptoms persist. If symptoms have not improved over the next 7 to 10 days, or worsen, you may follow-up in this clinic or with your primary care physician for further evaluation. Follow-up as needed.

## 2023-08-01 NOTE — ED Triage Notes (Signed)
Pt states sore throat,congestion,fever,congestion,bilateral ear pain  and states his asthma has been acting up. States he has been using his inhaler at home.

## 2023-08-02 LAB — SARS CORONAVIRUS 2 (TAT 6-24 HRS): SARS Coronavirus 2: NEGATIVE

## 2023-08-04 ENCOUNTER — Ambulatory Visit
Admission: EM | Admit: 2023-08-04 | Discharge: 2023-08-04 | Disposition: A | Discharge status: Home / Self Care | Payer: BC Managed Care – PPO | Attending: Nurse Practitioner | Admitting: Nurse Practitioner

## 2023-08-04 DIAGNOSIS — B309 Viral conjunctivitis, unspecified: Secondary | ICD-10-CM

## 2023-08-04 LAB — CULTURE, GROUP A STREP (THRC)

## 2023-08-04 MED ORDER — HYPROMELLOSE (GONIOSCOPIC) 2.5 % OP SOLN: 1.0000 [drp] | OPHTHALMIC | 0 refills | Status: AC | PRN

## 2023-08-04 NOTE — Discharge Instructions (Addendum)
As we discussed, the eye redness, itching, drainage, and burning is likely coming from a viral conjunctivitis which is coming from the sickness that you currently have.  This is not considered bacterial conjunctivitis or pinkeye.  However it is still contagious, so wash your hands frequently.  Start using the artificial tears eyedrops that have sent to the pharmacy.  You can also pick this up over-the-counter.  Seek care if symptoms do not improve with treatment.

## 2023-08-04 NOTE — ED Triage Notes (Signed)
Pt reports redness, irritation, itching to both eyes that started yesterday. Pt states he woke up with both eyes matted shut with yellow crust.

## 2023-08-04 NOTE — ED Provider Notes (Signed)
[facility]    CSN: [id number] Arrival date & time: 08/04/23  1334      History   Chief Complaint Chief Complaint  Patient presents with   Conjunctivitis    HPI Travis Aguilar is a 32 y.o. male.   Patient presents today with 1 day history of bilateral eye redness, irritation, itching, burning, and eyes matted shut this morning when he woke up.  Reports he had yellow crust this morning and it has not been as much throughout the day.  Reports he has been sick last week with sore throat, left ear pressure, runny/stuffy nose, slight cough.  He was seen and diagnosed with viral illness, given medications and is taking them sporadically.  Reports he is not feeling much better from that time.  He is concerned about pinkeye.  Denies known sick contacts with pinkeye.  Reports his kids went away over the weekend.  Does not work with children.  Patient denies foreign body sensation of either eye and denies contact lens use.    Past Medical History:  Diagnosis Date   Asthma    Hx of seasonal allergies    Influenza A     Patient Active Problem List   Diagnosis Date Noted   Pars defect of lumbar spine 08/18/2014   Asthma, moderate persistent 06/24/2013    Past Surgical History:  Procedure Laterality Date   SURGERY OF LIP     age 67 or 87   TONSILLECTOMY         Home Medications    Prior to Admission medications   Medication Sig Start Date End Date Taking? Authorizing Provider  fluticasone (FLONASE) 50 MCG/ACT nasal spray Place 2 sprays into both nostrils daily. 08/01/23  Yes Leath-Warren, Sadie Haber, NP  hydroxypropyl methylcellulose / hypromellose (ISOPTO TEARS / GONIOVISC) 2.5 % ophthalmic solution Place 1 drop into both eyes as needed for dry eyes. 08/04/23  Yes Valentino Nose, NP  promethazine-dextromethorphan (PROMETHAZINE-DM) 6.25-15 MG/5ML syrup Take 5 mLs by mouth 4 (four) times daily as needed. 04/23/23  Yes Particia Nearing, PA-C  albuterol  (PROVENTIL) (2.5 MG/3ML) 0.083% nebulizer solution Take 3 mLs (2.5 mg total) by nebulization every 6 (six) hours as needed for wheezing or shortness of breath. 08/01/23   Leath-Warren, Sadie Haber, NP  fluticasone-salmeterol (ADVAIR DISKUS) 250-50 MCG/ACT AEPB Inhale 1 puff into the lungs in the morning and at bedtime. 04/23/23   Particia Nearing, PA-C  ibuprofen (ADVIL) 600 MG tablet Take 1 tablet (600 mg total) by mouth 3 (three) times daily. 05/28/21   Derwood Kaplan, MD  magic mouthwash (nystatin, hydrocortisone, diphenhydrAMINE, lidocaine) suspension Swish and swallow 10 mLs 4 (four) times daily as needed for mouth pain. 08/01/23   Leath-Warren, Sadie Haber, NP  predniSONE (DELTASONE) 20 MG tablet Take 2 tablets (40 mg total) by mouth daily with breakfast. 04/23/23   Particia Nearing, PA-C    Family History Family History  Problem Relation Age of Onset   Cancer Other        breast   Healthy Mother    Diabetes Father     Social History Social History   Tobacco Use   Smoking status: Never   Smokeless tobacco: Never  Vaping Use   Vaping status: Never Used  Substance Use Topics   Alcohol use: Not Currently   Drug use: No     Allergies   Amoxil [amoxicillin] and Penicillins   Review of Systems Review of Systems Per HPI  Physical Exam  Triage Vital Signs ED Triage Vitals [08/04/23 1437]  Encounter Vitals Group     BP 130/81     Systolic BP Percentile      Diastolic BP Percentile      Pulse Rate 79     Resp 16     Temp 97.6 F (36.4 C)     Temp Source Tympanic     SpO2 98 %     Weight      Height      Head Circumference      Peak Flow      Pain Score 2     Pain Loc      Pain Education      Exclude from Growth Chart    No data found.  Updated Vital Signs BP 130/81 (BP Location: Right Arm)   Pulse 79   Temp 97.6 F (36.4 C) (Tympanic)   Resp 16   SpO2 98%   Visual Acuity Right Eye Distance:   Left Eye Distance:   Bilateral Distance:     Right Eye Near:   Left Eye Near:    Bilateral Near:     Physical Exam Vitals and nursing note reviewed.  Constitutional:      General: He is not in acute distress.    Appearance: Normal appearance. He is not toxic-appearing.  HENT:     Head: Normocephalic and atraumatic.     Left Ear: A middle ear effusion is present.     Nose: Congestion present. No rhinorrhea.     Mouth/Throat:     Mouth: Mucous membranes are moist.     Pharynx: Oropharynx is clear.  Eyes:     General: No scleral icterus.       Right eye: No discharge.        Left eye: No discharge.     Extraocular Movements: Extraocular movements intact.     Right eye: Normal extraocular motion.     Left eye: Normal extraocular motion.     Conjunctiva/sclera:     Right eye: Right conjunctiva is injected. No exudate or hemorrhage.    Left eye: Left conjunctiva is injected. No exudate or hemorrhage.    Pupils: Pupils are equal, round, and reactive to light. Pupils are equal.     Right eye: Pupil is round, reactive and not sluggish.     Left eye: Pupil is round, reactive and not sluggish.  Pulmonary:     Effort: Pulmonary effort is normal. No respiratory distress.  Musculoskeletal:     Cervical back: Normal range of motion.  Lymphadenopathy:     Cervical: No cervical adenopathy.  Skin:    General: Skin is warm and dry.     Coloration: Skin is not jaundiced or pale.     Findings: No erythema.  Neurological:     Mental Status: He is alert and oriented to person, place, and time.     Motor: No weakness.     Gait: Gait normal.  Psychiatric:        Behavior: Behavior is cooperative.      UC Treatments / Results  Labs (all labs ordered are listed, but only abnormal results are displayed) Labs Reviewed - No data to display  EKG   Radiology No results found.  Procedures Procedures (including critical care time)  Medications Ordered in UC Medications - No data to display  Initial Impression / Assessment  and Plan / UC Course  I have reviewed the triage vital signs and the nursing  notes.  Pertinent labs & imaging results that were available during my care of the patient were reviewed by me and considered in my medical decision making (see chart for details).   Patient is well-appearing, normotensive, afebrile, not tachycardic, not tachypneic, oxygenating well on room air.    1. Viral conjunctivitis Supportive care discussed Start artificial tears Recommended good hand hygiene, return for persistent/worsening symptoms despite treatment  The patient was given the opportunity to ask questions.  All questions answered to their satisfaction.  The patient is in agreement to this plan.    Final Clinical Impressions(s) / UC Diagnoses   Final diagnoses:  Viral conjunctivitis     Discharge Instructions      As we discussed, the eye redness, itching, drainage, and burning is likely coming from a viral conjunctivitis which is coming from the sickness that you currently have.  This is not considered bacterial conjunctivitis or pinkeye.  However it is still contagious, so wash your hands frequently.  Start using the artificial tears eyedrops that have sent to the pharmacy.  You can also pick this up over-the-counter.  Seek care if symptoms do not improve with treatment.    ED Prescriptions     Medication Sig Dispense Auth. Provider   hydroxypropyl methylcellulose / hypromellose (ISOPTO TEARS / GONIOVISC) 2.5 % ophthalmic solution Place 1 drop into both eyes as needed for dry eyes. 15 mL Valentino Nose, NP      PDMP not reviewed this encounter.   Valentino Nose, NP 08/04/23 [phone]

## 2023-09-04 IMAGING — DX DG CHEST 2V
2 series · 2 of 2 positions shown · non-contrast
Comparison: Chest x-ray 04/13/2014.

CLINICAL DATA: Left chest pain.

EXAM:
CHEST - 2 VIEW

[chest pa]
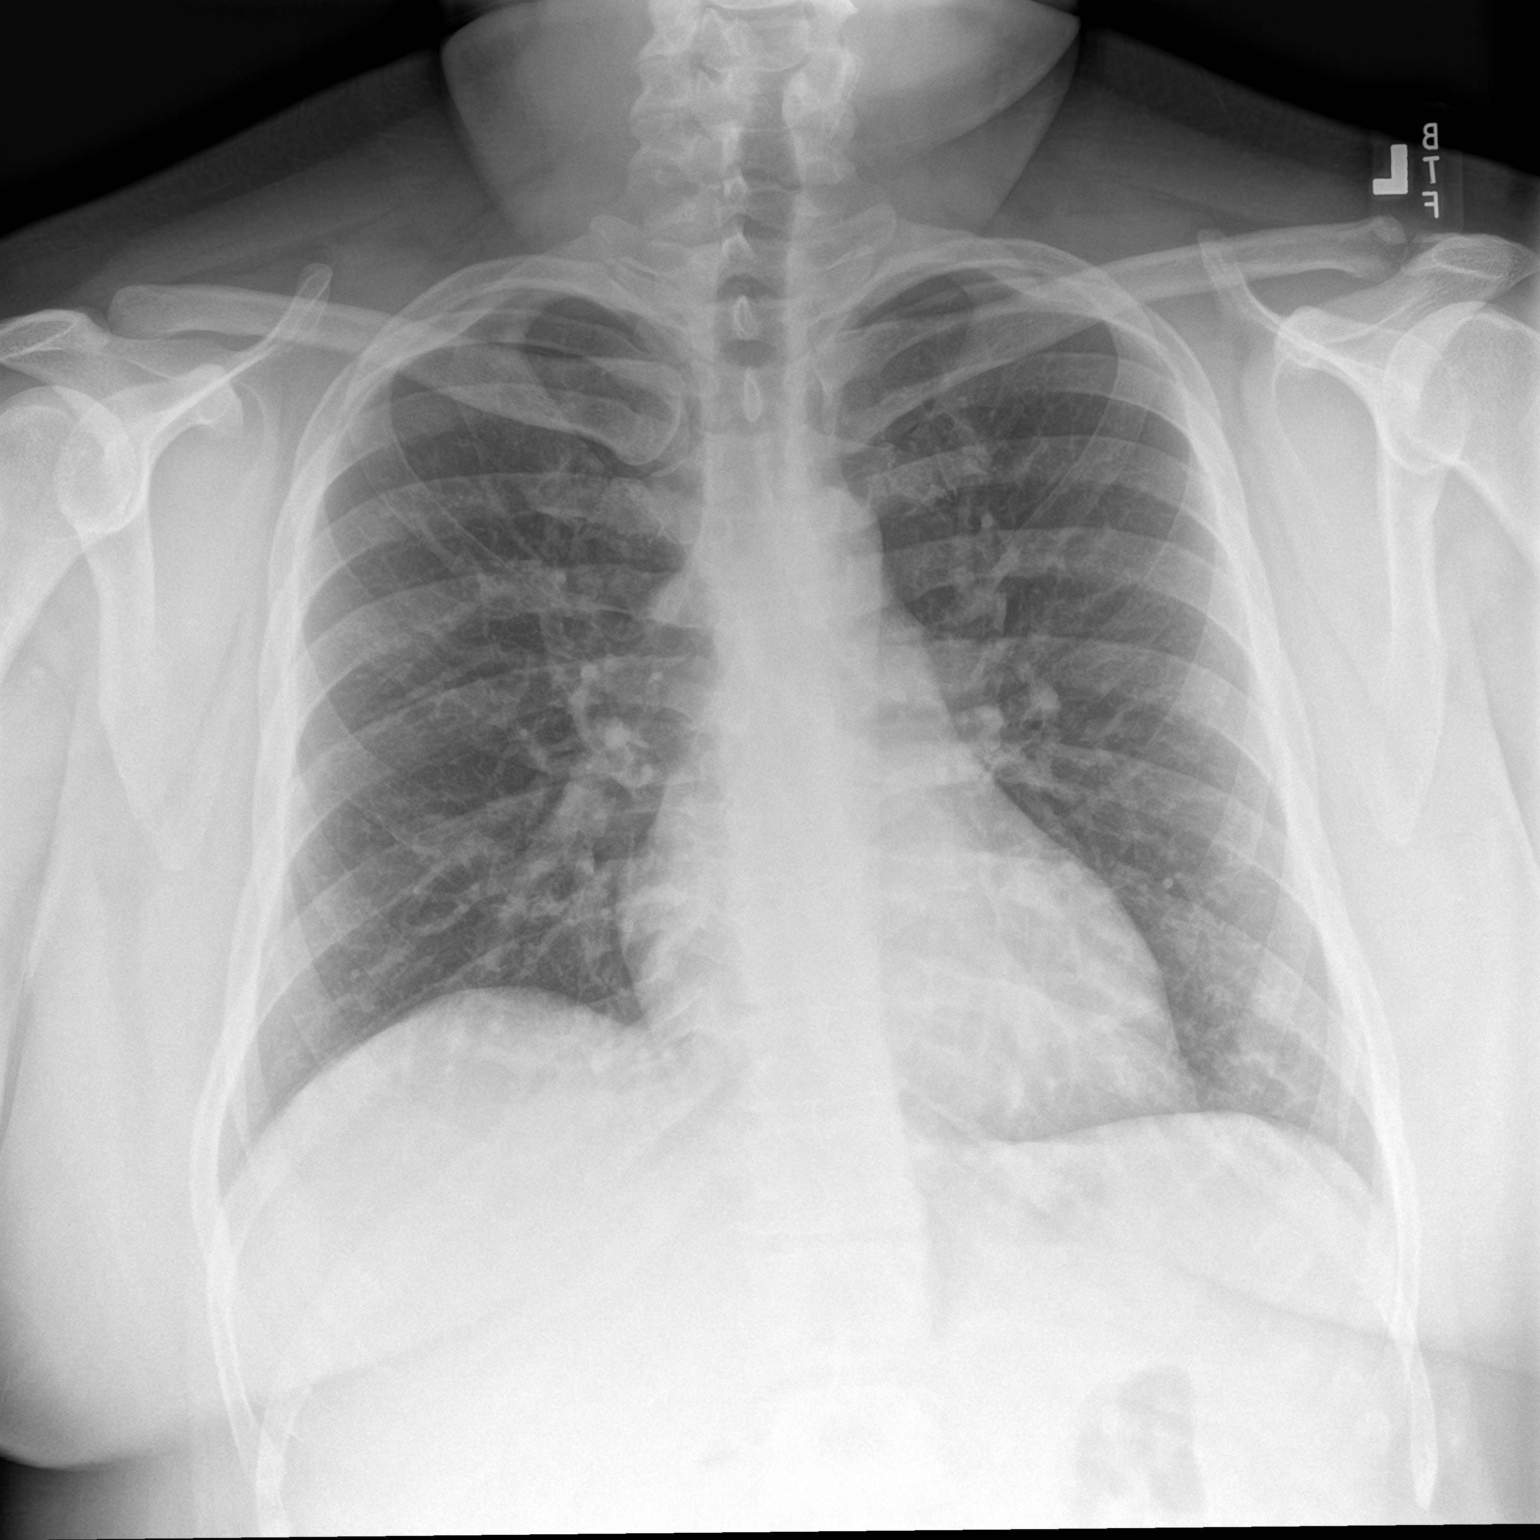

[chest lat]
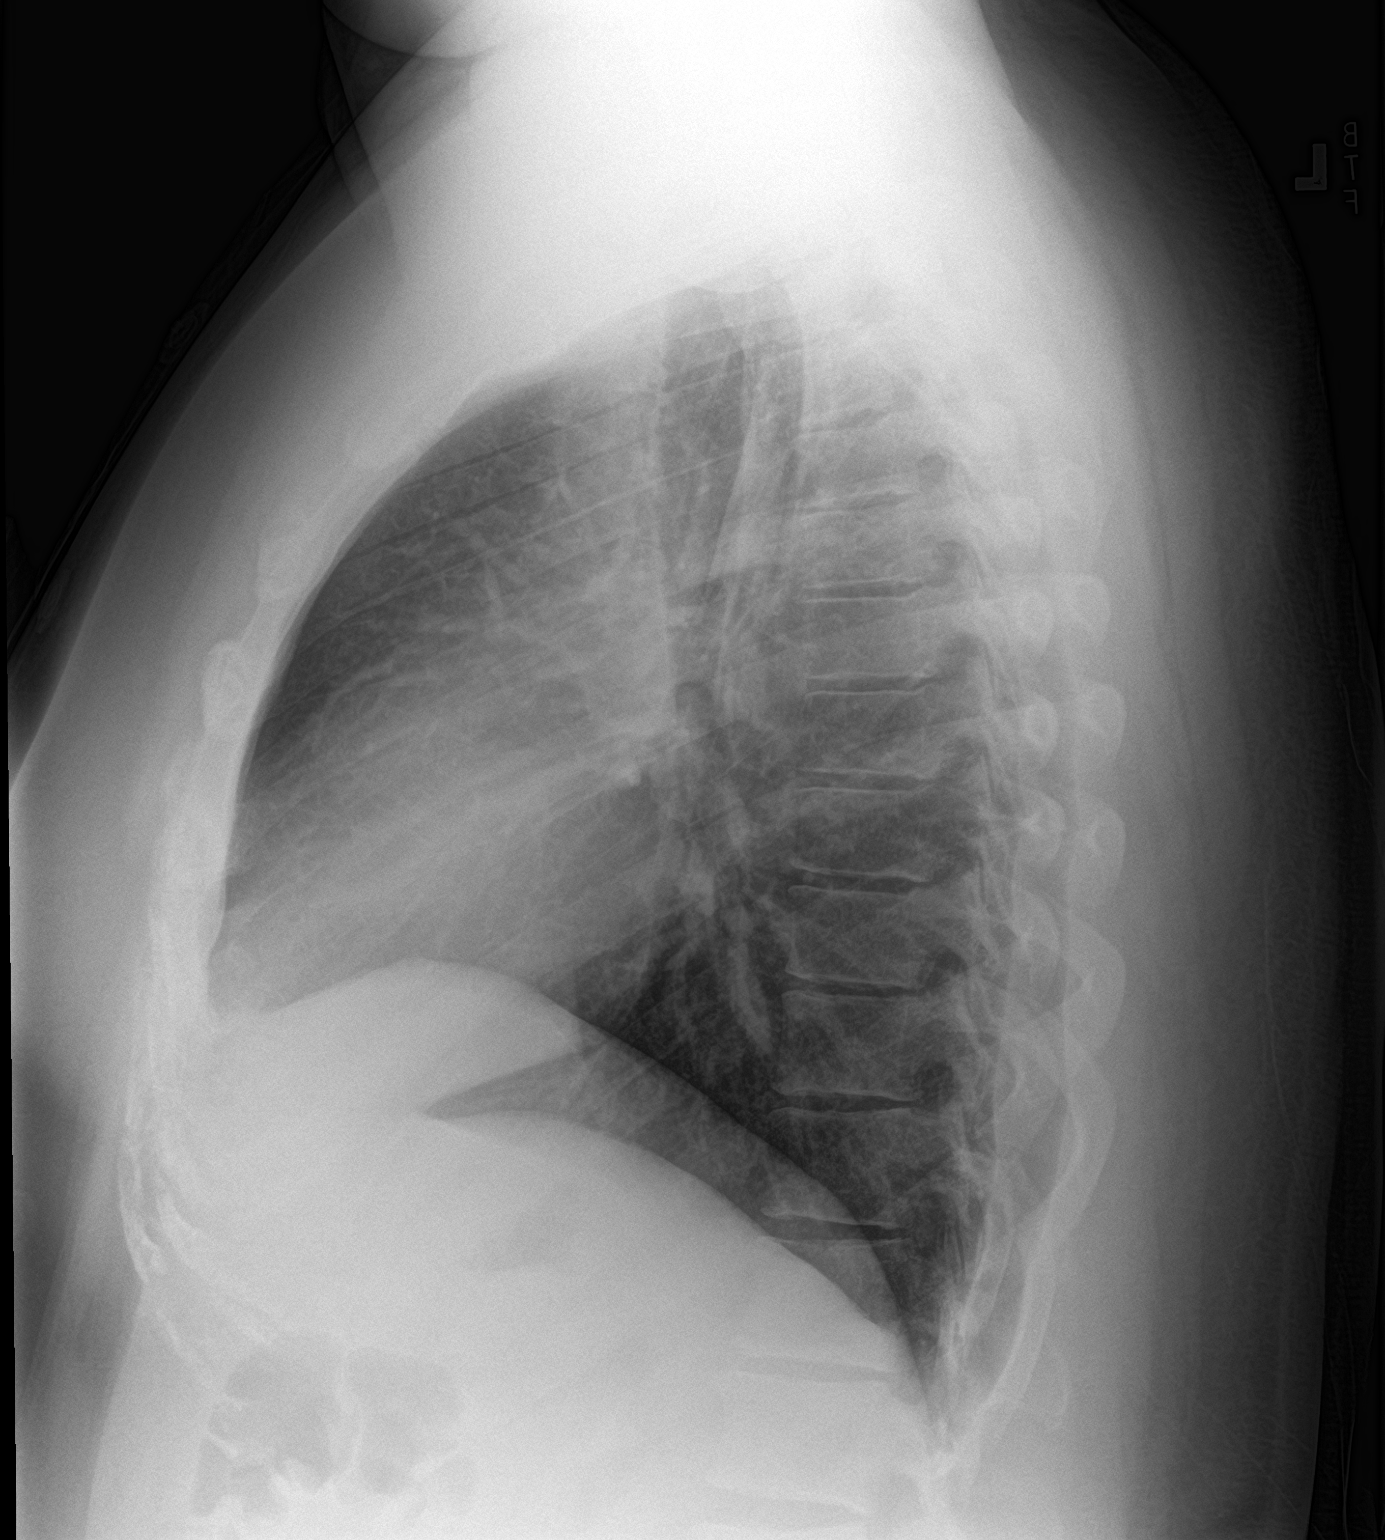

[2 of 2 positions shown; findings below may reference images not displayed]

FINDINGS: The heart size and mediastinal contours are within normal limits.
There is minimal strandy opacities in the left lower lung. There is
no focal lung consolidation. No pleural effusion or pneumothorax.
Visualized skeletal structures are unremarkable.
IMPRESSION: Minimal strandy opacities in the left lower lung favored as
atelectasis. Mild infection not excluded. No lung consolidation.
